# Patient Record
Sex: Female | Born: 1980 | Race: Black or African American | Hispanic: No | Smoking: Former smoker
Health system: Southern US, Community
[De-identification: ages and names within clinical notes are randomized; demographics above are authoritative.]

## PROBLEM LIST (undated history)

## (undated) DIAGNOSIS — N390 Urinary tract infection, site not specified: Secondary | ICD-10-CM

## (undated) DIAGNOSIS — F329 Major depressive disorder, single episode, unspecified: Secondary | ICD-10-CM

## (undated) DIAGNOSIS — K219 Gastro-esophageal reflux disease without esophagitis: Secondary | ICD-10-CM

## (undated) DIAGNOSIS — T7840XA Allergy, unspecified, initial encounter: Secondary | ICD-10-CM

## (undated) DIAGNOSIS — E282 Polycystic ovarian syndrome: Secondary | ICD-10-CM

## (undated) DIAGNOSIS — F319 Bipolar disorder, unspecified: Secondary | ICD-10-CM

## (undated) DIAGNOSIS — I10 Essential (primary) hypertension: Secondary | ICD-10-CM

## (undated) DIAGNOSIS — F32A Depression, unspecified: Secondary | ICD-10-CM

## (undated) DIAGNOSIS — J45909 Unspecified asthma, uncomplicated: Secondary | ICD-10-CM

## (undated) HISTORY — DX: Unspecified asthma, uncomplicated: J45.909

## (undated) HISTORY — PX: WISDOM TOOTH EXTRACTION: SHX21

## (undated) HISTORY — PX: OTHER SURGICAL HISTORY: SHX169

## (undated) HISTORY — DX: Allergy, unspecified, initial encounter: T78.40XA

---
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
## 2001-01-15 ENCOUNTER — Inpatient Hospital Stay (HOSPITAL_COMMUNITY): Admission: AD | Admit: 2001-01-15 | Discharge: 2001-01-15 | Payer: Self-pay | Admitting: Obstetrics & Gynecology

## 2001-01-15 ENCOUNTER — Encounter: Payer: Self-pay | Admitting: Gynecology

## 2001-02-19 ENCOUNTER — Emergency Department (HOSPITAL_COMMUNITY): Admission: EM | Admit: 2001-02-19 | Discharge: 2001-02-19 | Payer: Self-pay | Admitting: *Deleted

## 2001-02-19 ENCOUNTER — Encounter: Payer: Self-pay | Admitting: *Deleted

## 2001-03-25 ENCOUNTER — Emergency Department (HOSPITAL_COMMUNITY): Admission: EM | Admit: 2001-03-25 | Discharge: 2001-03-25 | Payer: Self-pay | Admitting: Emergency Medicine

## 2001-05-18 ENCOUNTER — Inpatient Hospital Stay (HOSPITAL_COMMUNITY): Admission: AD | Admit: 2001-05-18 | Discharge: 2001-05-18 | Payer: Self-pay | Admitting: *Deleted

## 2001-08-12 ENCOUNTER — Ambulatory Visit (HOSPITAL_COMMUNITY): Admission: RE | Admit: 2001-08-12 | Discharge: 2001-08-12 | Payer: Self-pay | Admitting: *Deleted

## 2001-11-17 ENCOUNTER — Inpatient Hospital Stay (HOSPITAL_COMMUNITY): Admission: AD | Admit: 2001-11-17 | Discharge: 2001-11-17 | Payer: Self-pay | Admitting: *Deleted

## 2002-01-13 ENCOUNTER — Inpatient Hospital Stay (HOSPITAL_COMMUNITY): Admission: AD | Admit: 2002-01-13 | Discharge: 2002-01-16 | Payer: Self-pay | Admitting: Family Medicine

## 2002-01-13 ENCOUNTER — Inpatient Hospital Stay (HOSPITAL_COMMUNITY): Admission: AD | Admit: 2002-01-13 | Discharge: 2002-01-13 | Payer: Self-pay | Admitting: Family Medicine

## 2003-03-13 ENCOUNTER — Emergency Department (HOSPITAL_COMMUNITY): Admission: EM | Admit: 2003-03-13 | Discharge: 2003-03-14 | Payer: Self-pay | Admitting: Emergency Medicine

## 2003-06-22 ENCOUNTER — Other Ambulatory Visit: Admission: RE | Admit: 2003-06-22 | Discharge: 2003-06-22 | Payer: Self-pay | Admitting: Family Medicine

## 2003-06-22 ENCOUNTER — Other Ambulatory Visit: Admission: RE | Admit: 2003-06-22 | Discharge: 2003-06-22 | Payer: Self-pay | Admitting: *Deleted

## 2003-07-18 ENCOUNTER — Encounter: Admission: RE | Admit: 2003-07-18 | Discharge: 2003-07-18 | Payer: Self-pay | Admitting: Family Medicine

## 2003-11-21 ENCOUNTER — Other Ambulatory Visit: Admission: RE | Admit: 2003-11-21 | Discharge: 2003-11-21 | Payer: Self-pay | Admitting: Family Medicine

## 2003-11-28 ENCOUNTER — Encounter: Admission: RE | Admit: 2003-11-28 | Discharge: 2003-11-28 | Payer: Self-pay | Admitting: Family Medicine

## 2004-02-10 ENCOUNTER — Ambulatory Visit (HOSPITAL_COMMUNITY): Admission: RE | Admit: 2004-02-10 | Discharge: 2004-02-10 | Payer: Self-pay | Admitting: *Deleted

## 2005-05-30 ENCOUNTER — Ambulatory Visit (HOSPITAL_COMMUNITY): Admission: RE | Admit: 2005-05-30 | Discharge: 2005-05-30 | Payer: Self-pay | Admitting: Family Medicine

## 2005-05-30 ENCOUNTER — Emergency Department (HOSPITAL_COMMUNITY): Admission: EM | Admit: 2005-05-30 | Discharge: 2005-05-30 | Payer: Self-pay | Admitting: Family Medicine

## 2007-01-25 ENCOUNTER — Emergency Department (HOSPITAL_COMMUNITY): Admission: EM | Admit: 2007-01-25 | Discharge: 2007-01-25 | Payer: Self-pay | Admitting: Emergency Medicine

## 2007-10-16 ENCOUNTER — Emergency Department (HOSPITAL_COMMUNITY): Admission: EM | Admit: 2007-10-16 | Discharge: 2007-10-16 | Payer: Self-pay | Admitting: Emergency Medicine

## 2009-07-06 ENCOUNTER — Ambulatory Visit: Admission: RE | Admit: 2009-07-06 | Discharge: 2009-07-06 | Payer: Self-pay | Admitting: Psychiatry

## 2009-07-07 ENCOUNTER — Ambulatory Visit: Payer: Self-pay | Admitting: Psychiatry

## 2009-07-07 ENCOUNTER — Inpatient Hospital Stay (HOSPITAL_COMMUNITY): Admission: AD | Admit: 2009-07-07 | Discharge: 2009-07-14 | Payer: Self-pay | Admitting: Psychiatry

## 2009-07-19 ENCOUNTER — Other Ambulatory Visit (HOSPITAL_COMMUNITY): Admission: RE | Admit: 2009-07-19 | Discharge: 2009-10-11 | Payer: Self-pay | Admitting: Psychiatry

## 2009-09-22 ENCOUNTER — Ambulatory Visit (HOSPITAL_COMMUNITY): Payer: Self-pay | Admitting: Psychiatry

## 2009-10-06 ENCOUNTER — Ambulatory Visit (HOSPITAL_COMMUNITY): Payer: Self-pay | Admitting: Psychiatry

## 2009-10-24 ENCOUNTER — Ambulatory Visit (HOSPITAL_COMMUNITY): Payer: Self-pay | Admitting: Psychiatry

## 2009-12-08 ENCOUNTER — Ambulatory Visit (HOSPITAL_COMMUNITY): Payer: Self-pay | Admitting: Psychiatry

## 2009-12-10 ENCOUNTER — Emergency Department (HOSPITAL_COMMUNITY): Admission: EM | Admit: 2009-12-10 | Discharge: 2009-12-11 | Payer: Self-pay | Admitting: Emergency Medicine

## 2010-02-08 ENCOUNTER — Emergency Department (HOSPITAL_COMMUNITY): Admission: EM | Admit: 2010-02-08 | Discharge: 2009-07-07 | Payer: Self-pay | Admitting: Emergency Medicine

## 2010-03-20 ENCOUNTER — Ambulatory Visit (HOSPITAL_COMMUNITY)
Admission: RE | Admit: 2010-03-20 | Discharge: 2010-03-20 | Payer: Self-pay | Source: Home / Self Care | Attending: Psychiatry | Admitting: Psychiatry

## 2010-03-28 ENCOUNTER — Ambulatory Visit (HOSPITAL_COMMUNITY)
Admission: RE | Admit: 2010-03-28 | Discharge: 2010-03-28 | Payer: Self-pay | Source: Home / Self Care | Attending: Psychiatry | Admitting: Psychiatry

## 2010-04-03 ENCOUNTER — Ambulatory Visit (HOSPITAL_COMMUNITY)
Admission: RE | Admit: 2010-04-03 | Discharge: 2010-04-03 | Payer: Self-pay | Source: Home / Self Care | Attending: Marriage and Family Therapist | Admitting: Marriage and Family Therapist

## 2010-04-26 ENCOUNTER — Encounter (HOSPITAL_COMMUNITY): Payer: 59 | Admitting: Marriage and Family Therapist

## 2010-04-26 DIAGNOSIS — F3160 Bipolar disorder, current episode mixed, unspecified: Secondary | ICD-10-CM

## 2010-04-27 ENCOUNTER — Encounter (HOSPITAL_COMMUNITY): Payer: 59 | Admitting: Physician Assistant

## 2010-04-27 DIAGNOSIS — F319 Bipolar disorder, unspecified: Secondary | ICD-10-CM

## 2010-05-03 ENCOUNTER — Encounter (HOSPITAL_COMMUNITY): Payer: 59 | Admitting: Marriage and Family Therapist

## 2010-05-08 ENCOUNTER — Encounter (HOSPITAL_COMMUNITY): Payer: 59 | Admitting: Marriage and Family Therapist

## 2010-05-08 DIAGNOSIS — F3160 Bipolar disorder, current episode mixed, unspecified: Secondary | ICD-10-CM

## 2010-05-16 ENCOUNTER — Encounter (HOSPITAL_COMMUNITY): Payer: 59 | Admitting: Marriage and Family Therapist

## 2010-05-16 LAB — CBC
HCT: 41.8 % (ref 36.0–46.0)
Hemoglobin: 14.2 g/dL (ref 12.0–15.0)
MCH: 32.9 pg (ref 26.0–34.0)
MCV: 96.7 fL (ref 78.0–100.0)
Platelets: 196 10*3/uL (ref 150–400)
RDW: 13 % (ref 11.5–15.5)
WBC: 11 10*3/uL — ABNORMAL HIGH (ref 4.0–10.5)

## 2010-05-16 LAB — DIFFERENTIAL
Eosinophils Absolute: 0.1 10*3/uL (ref 0.0–0.7)
Eosinophils Relative: 1 % (ref 0–5)
Lymphocytes Relative: 42 % (ref 12–46)
Monocytes Relative: 5 % (ref 3–12)

## 2010-05-16 LAB — RAPID URINE DRUG SCREEN, HOSP PERFORMED
Benzodiazepines: NOT DETECTED
Cocaine: NOT DETECTED
Tetrahydrocannabinol: NOT DETECTED

## 2010-05-16 LAB — BASIC METABOLIC PANEL
Creatinine, Ser: 0.77 mg/dL (ref 0.4–1.2)
GFR calc non Af Amer: >60 mL/min (ref >60)

## 2010-05-16 LAB — URINALYSIS, ROUTINE W REFLEX MICROSCOPIC
Glucose, UA: NEGATIVE mg/dL
Protein, ur: NEGATIVE mg/dL
Specific Gravity, Urine: 1.008 (ref 1.005–1.030)
pH: 6.5 (ref 5.0–8.0)

## 2010-05-16 LAB — PREGNANCY, URINE: Preg Test, Ur: NEGATIVE

## 2010-05-16 LAB — ETHANOL: Alcohol, Ethyl (B): 5 mg/dL (ref 0–10)

## 2010-05-20 LAB — URINE DRUGS OF ABUSE SCREEN W ALC, ROUTINE (REF LAB)
Amphetamine Screen, Ur: NEGATIVE
Barbiturate Quant, Ur: NEGATIVE
Cocaine Metabolites: NEGATIVE
Marijuana Metabolite: NEGATIVE
Methadone: NEGATIVE
Opiate Screen, Urine: NEGATIVE
Phencyclidine (PCP): NEGATIVE
Propoxyphene: NEGATIVE

## 2010-05-21 ENCOUNTER — Encounter (HOSPITAL_COMMUNITY): Payer: 59 | Admitting: Marriage and Family Therapist

## 2010-05-21 DIAGNOSIS — F3160 Bipolar disorder, current episode mixed, unspecified: Secondary | ICD-10-CM

## 2010-05-21 LAB — URINE DRUGS OF ABUSE SCREEN W ALC, ROUTINE (REF LAB)
Amphetamine Screen, Ur: NEGATIVE
Amphetamine Screen, Ur: NEGATIVE
Barbiturate Quant, Ur: NEGATIVE
Cocaine Metabolites: NEGATIVE
Creatinine,U: 146.7 mg/dL
Creatinine,U: 53.5 mg/dL
Ethyl Alcohol: <10 mg/dL (ref <10)
Marijuana Metabolite: NEGATIVE
Methadone: NEGATIVE
Opiate Screen, Urine: NEGATIVE
Opiate Screen, Urine: NEGATIVE
Propoxyphene: NEGATIVE

## 2010-05-21 LAB — BENZODIAZEPINE, QUANTITATIVE, URINE
Alprazolam (GC/LC/MS), ur confirm: NEGATIVE NG/ML
Nordiazepam GC/MS Conf: NEGATIVE NG/ML
Oxazepam GC/MS Conf: 243 NG/ML — ABNORMAL HIGH
Temazepam GC/MS Conf: NEGATIVE NG/ML

## 2010-05-22 LAB — URINALYSIS, MICROSCOPIC ONLY
Glucose, UA: NEGATIVE mg/dL
Ketones, ur: NEGATIVE mg/dL
Protein, ur: NEGATIVE mg/dL

## 2010-05-22 LAB — DIFFERENTIAL
Basophils Absolute: 0 10*3/uL (ref 0.0–0.1)
Eosinophils Relative: 3 % (ref 0–5)
Lymphocytes Relative: 37 % (ref 12–46)
Lymphs Abs: 3.4 10*3/uL (ref 0.7–4.0)
Monocytes Absolute: 0.5 10*3/uL (ref 0.1–1.0)
Monocytes Relative: 5 % (ref 3–12)
Neutrophils Relative %: 55 % (ref 43–77)

## 2010-05-22 LAB — BASIC METABOLIC PANEL
BUN: 10 mg/dL (ref 6–23)
Calcium: 8.9 mg/dL (ref 8.4–10.5)
Creatinine, Ser: 0.75 mg/dL (ref 0.4–1.2)
GFR calc non Af Amer: >60 mL/min (ref >60)
Potassium: 3.4 mEq/L — ABNORMAL LOW (ref 3.5–5.1)
Sodium: 136 mEq/L (ref 135–145)

## 2010-05-22 LAB — CBC
RDW: 13.7 % (ref 11.5–15.5)
WBC: 9.1 10*3/uL (ref 4.0–10.5)

## 2010-05-22 LAB — URINALYSIS, ROUTINE W REFLEX MICROSCOPIC
Leukocytes, UA: NEGATIVE
Nitrite: NEGATIVE
Protein, ur: NEGATIVE mg/dL
Specific Gravity, Urine: 1.016 (ref 1.005–1.030)
Urobilinogen, UA: 1 mg/dL (ref 0.0–1.0)
pH: 6 (ref 5.0–8.0)

## 2010-05-22 LAB — RAPID URINE DRUG SCREEN, HOSP PERFORMED
Amphetamines: NOT DETECTED
Barbiturates: NOT DETECTED
Benzodiazepines: POSITIVE — AB

## 2010-05-22 LAB — URINE MICROSCOPIC-ADD ON

## 2010-05-22 LAB — POCT PREGNANCY, URINE: Preg Test, Ur: NEGATIVE

## 2010-05-29 ENCOUNTER — Encounter (HOSPITAL_COMMUNITY): Payer: 59 | Admitting: Physician Assistant

## 2010-05-29 DIAGNOSIS — F319 Bipolar disorder, unspecified: Secondary | ICD-10-CM

## 2010-06-05 ENCOUNTER — Encounter (HOSPITAL_COMMUNITY): Payer: 59 | Admitting: Marriage and Family Therapist

## 2010-06-07 ENCOUNTER — Emergency Department (HOSPITAL_COMMUNITY)
Admission: EM | Admit: 2010-06-07 | Discharge: 2010-06-07 | Disposition: A | Discharge status: Home / Self Care | Payer: 59 | Attending: Emergency Medicine | Admitting: Emergency Medicine

## 2010-06-07 ENCOUNTER — Emergency Department (HOSPITAL_COMMUNITY): Payer: 59

## 2010-06-07 DIAGNOSIS — Z79899 Other long term (current) drug therapy: Secondary | ICD-10-CM | POA: Insufficient documentation

## 2010-06-07 DIAGNOSIS — R42 Dizziness and giddiness: Secondary | ICD-10-CM | POA: Insufficient documentation

## 2010-06-07 DIAGNOSIS — F319 Bipolar disorder, unspecified: Secondary | ICD-10-CM | POA: Insufficient documentation

## 2010-06-07 LAB — COMPREHENSIVE METABOLIC PANEL
ALT: 18 U/L (ref 0–35)
Alkaline Phosphatase: 33 U/L — ABNORMAL LOW (ref 39–117)
CO2: 25 mEq/L (ref 19–32)
Chloride: 105 mEq/L (ref 96–112)
GFR calc non Af Amer: >60 mL/min (ref >60)
Glucose, Bld: 83 mg/dL (ref 70–99)
Potassium: 3.4 mEq/L — ABNORMAL LOW (ref 3.5–5.1)
Sodium: 138 mEq/L (ref 135–145)

## 2010-06-07 LAB — VALPROIC ACID LEVEL: Valproic Acid Lvl: 33.9 ug/mL — ABNORMAL LOW (ref 50.0–100.0)

## 2010-06-07 LAB — CBC
HCT: 42 % (ref 36.0–46.0)
Hemoglobin: 13.9 g/dL (ref 12.0–15.0)
MCHC: 33.1 g/dL (ref 30.0–36.0)
RBC: 4.44 MIL/uL (ref 3.87–5.11)
WBC: 7.4 10*3/uL (ref 4.0–10.5)

## 2010-06-12 ENCOUNTER — Encounter (HOSPITAL_COMMUNITY): Payer: 59 | Admitting: Marriage and Family Therapist

## 2010-06-14 ENCOUNTER — Encounter (HOSPITAL_COMMUNITY): Payer: 59 | Admitting: Marriage and Family Therapist

## 2010-06-20 ENCOUNTER — Encounter (HOSPITAL_COMMUNITY): Payer: 59 | Admitting: Marriage and Family Therapist

## 2010-06-28 ENCOUNTER — Encounter (HOSPITAL_COMMUNITY): Payer: 59 | Admitting: Marriage and Family Therapist

## 2010-06-28 DIAGNOSIS — F3160 Bipolar disorder, current episode mixed, unspecified: Secondary | ICD-10-CM

## 2010-07-01 ENCOUNTER — Inpatient Hospital Stay (HOSPITAL_COMMUNITY): Admission: RE | Admit: 2010-07-01 | Payer: 59 | Source: Ambulatory Visit

## 2010-07-06 ENCOUNTER — Encounter (HOSPITAL_COMMUNITY): Payer: 59 | Admitting: Physician Assistant

## 2010-07-06 DIAGNOSIS — F319 Bipolar disorder, unspecified: Secondary | ICD-10-CM

## 2010-07-20 NOTE — Discharge Summary (Signed)
[facility]  Patient:    [name], [name]                         MRN: [id number] Adm. Date:  [id number] Disc. Date: 11/14/99 Attending:  Otilio Saber                           Discharge Summary  ADMISSION DIAGNOSES: Axis I:    Major depression, single episode, with severe suicidal ideas. Axis II:   Deferred. Axis III:  Bronchial asthma. Axis IV:   Severe, primary support group. Axis V:    Global assessment of functioning on admission 35 highest in past            year is 75.  DISCHARGE DIAGNOSES: Axis I:    Major depressive disorder, single episode, with suicidal ideas. Axis II:   Deferred. Axis III:  Bronchial asthma. Axis IV:   Severe, primary support group. Axis V:    Global assessment of functioning on admission 35, on            discharge 80.  BRIEF HISTORY:  The patient is a 30 year old single African-American female admitted on a voluntary basis because of depression and suicidal ideas with a plan to cut her wrists or overdose.  The patient has been depressed through the summer and on admission she endorsed symptoms of anhedonia, anorexia, insomnia, feelings of uselessness and hopeless with suicidal ideas and plans to take either pills or cut herself with a knife.  Stressors included economic issues and being overwhelmed at school.  PERTINENT PHYSICAL AND LABORATORY DATA:  Physical examination on admission was unremarkable.  CBC was within normal limits.  Values on routine chemistry including electrolytes and liver profile were normal.  Thyroid profile was normal.  Urine pregnancy test was negative.  HOSPITAL COURSE:  The patient was established on a combination of Celexa and Ambien with excellent response.  She slept well for the night she was in the hospital and reported this has helped her a great deal.  She tolerated the Celexa and Ambien well and without side effects.  CONDITION ON DISCHARGE:  The patient is much improved.   Suicidal aspirations have totally resolved.  Her affect is full and her mood is stable.  She is hopeful and ready to go back to school at [employer] and expects to perform well within the context of the school.  We discussed follow-up and she does not have a primary physician to discuss her medications.  She agreed to go to the mental health center once or twice until she establishes with a primary care physician.  In the meantime, she is to call her insurance company and find out the name of therapists on their panel and establish with one of their therapists in outpatient psychotherapy.  MEDICATIONS:  The patient is discharged with prescriptions for Celexa 20 mg daily and Ambien 5 mg h.s. p.r.n.  FOLLOW-UP:  She is to follow with a therapist and to follow at [facility] for the time being.  She was advised not drink alcoholic beverages or use drugs. DD:  11/14/99 TD:  11/14/99 Job: [id number]
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
## 2010-07-20 NOTE — Op Note (Signed)
[name], PETZ                ACCOUNT NO.:  [id number]   MEDICAL RECORD NO.:  [id number]          PATIENT TYPE:  AMB   LOCATION:  SDC                           FACILITY:  [facility]   PHYSICIAN:  [place] B. Earlene Plater, M.D.  DATE OF BIRTH:  [birth year]   DATE OF PROCEDURE:  02/10/2004  DATE OF DISCHARGE:                                 OPERATIVE REPORT   PREOPERATIVE DIAGNOSIS:  Chronic right lower quadrant pain and dysmenorrhea.   POSTOPERATIVE DIAGNOSIS:  Chronic right lower quadrant pain and  dysmenorrhea.   PROCEDURE:  Open diagnostic laparoscopy.   SURGEON:  Chester Holstein. Earlene Plater, M.D.   ANESTHESIA:  General.   FINDINGS:  Normal appearing uterus, tubes, and ovaries, right lower  quadrant, appendix, gallbladder, liver edge, and stomach.  No evidence of  endometriosis or pelvic adhesive disease.   ESTIMATED BLOOD LOSS:  Less than 25 mL.   COMPLICATIONS:  None.   INDICATIONS FOR PROCEDURE:  The patient with a long history of chronic right  lower quadrant pain and dysmenorrhea with associated dyspareunia.  This has  not respond to conservative medical therapy.  The patient presents for  diagnostic testing and potential treatment if pathology identified.   The patient was advised of the risks of surgery including infection,  bleeding, damage to bowel, bladder, or other organs.   DESCRIPTION OF PROCEDURE:  The patient was taken to the operating room and  general anesthesia was obtained.  The patient was prepped and draped in the  usual sterile fashion and Foley catheter inserted into the bladder.   The speculum was inserted.  A single tooth tenaculum attached to the  anterior lip of the cervix and Hulka tenaculum inserted.   A 10 mm infraumbilical incision was made with a knife and carried sharply to  the fascia.  The fascia was divided sharply and elevated with Kocher clamps.  A posterior sheath and peritoneum were elevated and entered sharply.  Pursestring suture of 0 Vicryl placed  around the fascial defect.  Hasson  cannula inserted and secured.  Pneumoperitoneum obtained with CO2 gas.   Trendelenburg position obtained.  Bowel mobilized superiorly with blunt  probe through the operative scope.  The abdomen and pelvis were inspected  with the above findings noted.   As no pathology was identified, the procedure was terminated.   The scope was removed and gas released.  The incision was elevated with [employer] retractors, the gas released completely, and the pursestring sutures  snugged down.  This obliterated the fascial defect and no intra-abdominal  contents herniated through prior to closure.  The skin was closed with a  subcuticular 4-0 Vicryl.   The Hulka was removed and the cervix hemostatic.   The patient tolerated the procedure well and there were no complications.  She was taken to the recovery room awake, alert, and in stable condition.     Wesl   WBD/MEDQ  D:  02/10/2004  T:  02/10/2004  Job:  [id number]

## 2010-08-14 LAB — ANTIBODY SCREEN: Antibody Screen: NEGATIVE

## 2010-08-14 LAB — ABO/RH: RH Type: POSITIVE

## 2010-08-14 LAB — HIV ANTIBODY (ROUTINE TESTING W REFLEX): HIV: NONREACTIVE

## 2010-09-11 ENCOUNTER — Encounter (HOSPITAL_COMMUNITY): Payer: 59 | Admitting: Physician Assistant

## 2010-11-01 ENCOUNTER — Inpatient Hospital Stay (HOSPITAL_COMMUNITY)
Admission: AD | Admit: 2010-11-01 | Discharge: 2010-11-01 | Disposition: A | Discharge status: Home / Self Care | Payer: 59 | Source: Ambulatory Visit | Attending: Obstetrics and Gynecology | Admitting: Obstetrics and Gynecology

## 2010-11-01 ENCOUNTER — Encounter (HOSPITAL_COMMUNITY): Payer: Self-pay | Admitting: *Deleted

## 2010-11-01 DIAGNOSIS — M62830 Muscle spasm of back: Secondary | ICD-10-CM

## 2010-11-01 DIAGNOSIS — M5380 Other specified dorsopathies, site unspecified: Secondary | ICD-10-CM | POA: Insufficient documentation

## 2010-11-01 DIAGNOSIS — M259 Joint disorder, unspecified: Secondary | ICD-10-CM | POA: Insufficient documentation

## 2010-11-01 DIAGNOSIS — F319 Bipolar disorder, unspecified: Secondary | ICD-10-CM | POA: Insufficient documentation

## 2010-11-01 DIAGNOSIS — M899 Disorder of bone, unspecified: Secondary | ICD-10-CM | POA: Insufficient documentation

## 2010-11-01 DIAGNOSIS — O9989 Other specified diseases and conditions complicating pregnancy, childbirth and the puerperium: Secondary | ICD-10-CM | POA: Insufficient documentation

## 2010-11-01 DIAGNOSIS — O99340 Other mental disorders complicating pregnancy, unspecified trimester: Secondary | ICD-10-CM | POA: Insufficient documentation

## 2010-11-01 HISTORY — DX: Urinary tract infection, site not specified: N39.0

## 2010-11-01 HISTORY — DX: Major depressive disorder, single episode, unspecified: F32.9

## 2010-11-01 HISTORY — DX: Polycystic ovarian syndrome: E28.2

## 2010-11-01 HISTORY — DX: Depression, unspecified: F32.A

## 2010-11-01 HISTORY — DX: Essential (primary) hypertension: I10

## 2010-11-01 HISTORY — DX: Bipolar disorder, unspecified: F31.9

## 2010-11-01 LAB — URINALYSIS, ROUTINE W REFLEX MICROSCOPIC
Bilirubin Urine: NEGATIVE
Hgb urine dipstick: NEGATIVE
Ketones, ur: NEGATIVE mg/dL
Nitrite: NEGATIVE
Urobilinogen, UA: 0.2 mg/dL (ref 0.0–1.0)

## 2010-11-01 LAB — DIFFERENTIAL
Eosinophils Absolute: 0.2 10*3/uL (ref 0.0–0.7)
Lymphocytes Relative: 27 % (ref 12–46)
Lymphs Abs: 3.1 10*3/uL (ref 0.7–4.0)
Monocytes Relative: 7 % (ref 3–12)
Neutrophils Relative %: 64 % (ref 43–77)

## 2010-11-01 LAB — CBC
Hemoglobin: 11.9 g/dL — ABNORMAL LOW (ref 12.0–15.0)
MCH: 30.6 pg (ref 26.0–34.0)
MCV: 90 fL (ref 78.0–100.0)
RBC: 3.89 MIL/uL (ref 3.87–5.11)
WBC: 11.4 10*3/uL — ABNORMAL HIGH (ref 4.0–10.5)

## 2010-11-01 LAB — WET PREP, GENITAL: Clue Cells Wet Prep HPF POC: NONE SEEN

## 2010-11-01 MED ORDER — CYCLOBENZAPRINE HCL 10 MG PO TABS: 10.0000 mg | ORAL_TABLET | Freq: Three times a day (TID) | ORAL | Status: AC | PRN

## 2010-11-01 MED ORDER — CYCLOBENZAPRINE HCL 10 MG PO TABS
10.0000 mg | ORAL_TABLET | Freq: Once | ORAL | Status: AC
  Administered 2010-11-01: 10 mg via ORAL
  Filled 2010-11-01: qty 1

## 2010-11-01 NOTE — ED Provider Notes (Signed)
History     Chief Complaint  Patient presents with  . Back Pain   HPI   See previous note--Dr. Ellyn Hack signed prior to completion.   Past Medical History  Diagnosis Date  . Depression   . Bipolar 1 disorder   . Hypertension   . UTI (lower urinary tract infection)   . PCOS (polycystic ovarian syndrome)     Past Surgical History  Procedure Date  . Cyst removed     ovarian cyst removed  . Wisdom tooth extraction     No family history on file.  History  Substance Use Topics  . Smoking status: Former Smoker    Types: Cigarettes  . Smokeless tobacco: Not on file  . Alcohol Use: No    Allergies: No Known Allergies  Prescriptions prior to admission  Medication Sig Dispense Refill  . acetaminophen (TYLENOL) 325 MG tablet Take 650 mg by mouth every 6 (six) hours as needed.        . prenatal vitamin w/FE, FA (PRENATAL 1 + 1) 27-1 MG TABS Take 1 tablet by mouth daily.        . ranitidine (ZANTAC) 150 MG tablet Take 150 mg by mouth 2 (two) times daily.          ROS Physical Exam   Blood pressure 149/83, pulse 84, temperature 99.2 F (37.3 C), temperature source Oral, resp. rate 20, height 5\' 3"  (1.6 m), weight 203 lb (92.08 kg).  Physical Exam  MAU Course  Procedures  MDM   Flexeril 10mg  po given in MAU.  Assessment and Plan  A:  Back spasm P:  Flexeril 10mg  po tid prn #20.  Keep appointment with Dr. Ellyn Hack. KEY,EVE Judie Petit 11/01/2010, 9:38 PM   Matt Holmes, NP 11/07/10 2051

## 2010-11-01 NOTE — Progress Notes (Signed)
Pt states she has been telling her physician for several weeks that she has pain with urination and frequency but her urinalysis was nml

## 2010-11-01 NOTE — Progress Notes (Signed)
Pt reports lower back "spasms" today, this pm had two tightening sensations in lower abd. Today has had abn vaginal discharge that is yellow mucus. Yesterday noted her underwear were damp/wet.

## 2010-11-01 NOTE — Progress Notes (Signed)
Patient is here with c/o chronic back pain that started radiated to her lower abdomen. She describes it as spasm and intense pain. She is also c/o yellow vaginal discharge. That was watery yesterday. Reports good fetal movement and denies any vaginal bleeding.

## 2010-11-01 NOTE — ED Provider Notes (Signed)
History     Chief Complaint  Patient presents with  . Back Pain   HPIChivon E Boyd 30 y.o.  is a patient of Dr. Emeline Boyd who presents with back pain.  Hx of chronic back pain but today it has changed to "spasms that roll to the front and the lower stomach is burning".  She is [redacted]w[redacted]d.  G2 P1.  She also states she has a yellow discharge X 2 months.  She reports good fetal movement.  Denies vaginal bleeding or leaking of fluid.    Past Medical History  Diagnosis Date  . Depression   . Bipolar 1 disorder   . Hypertension   . UTI (lower urinary tract infection)   . PCOS (polycystic ovarian syndrome)     Past Surgical History  Procedure Date  . Cyst removed     ovarian cyst removed  . Wisdom tooth extraction     No family history on file.  History  Substance Use Topics  . Smoking status: Former Smoker    Types: Cigarettes  . Smokeless tobacco: Not on file  . Alcohol Use: No    Allergies: No Known Allergies  Prescriptions prior to admission  Medication Sig Dispense Refill  . acetaminophen (TYLENOL) 325 MG tablet Take 650 mg by mouth every 6 (six) hours as needed.        . prenatal vitamin w/FE, FA (PRENATAL 1 + 1) 27-1 MG TABS Take 1 tablet by mouth daily.        . ranitidine (ZANTAC) 150 MG tablet Take 150 mg by mouth 2 (two) times daily.          Review of Systems  Constitutional: Negative for fever and chills.  Gastrointestinal: Positive for nausea and abdominal pain. Negative for vomiting.  Genitourinary:       Yellow vaginal discharge  Musculoskeletal: Positive for back pain.   Physical Exam   Blood pressure 149/83, pulse 84, temperature 99.2 F (37.3 C), temperature source Oral, resp. rate 20, height 5\' 3"  (1.6 m), weight 203 lb (92.08 kg).  Physical Exam  Constitutional: She is oriented to person, place, and time. She appears well-developed and well-nourished.       uncomfortable  Neck: Normal range of motion.  Respiratory: Effort normal.  GI: Soft.    Genitourinary: Cervix exhibits no motion tenderness, no discharge and no friability. No erythema or bleeding around the vagina. Vaginal discharge found.       Gravid with uterus at umbilicus.  Nontender.  Negative for flank pain.  Without vaginal bleeding.  Musculoskeletal: Tenderness: lower back.  Neurological: She is alert and oriented to person, place, and time.  Skin: Skin is warm and dry.    MAU Course  Procedures  MDM Consulted with Dr. Ellyn Boyd.  Reported UA, chief complaint and physical exam.  Order given for Flexeril 10mg  po now and may d/c to home with Rx.  She may also take Ibuprofen 600-800mg  po for 2 doses if needed.  Heating pad at home.  Assessment and Plan  Back Spasm [redacted] weeks pregnant  KEY,[name], 8:33 PM

## 2011-02-15 ENCOUNTER — Inpatient Hospital Stay (HOSPITAL_COMMUNITY)
Admission: AD | Admit: 2011-02-15 | Discharge: 2011-02-16 | Disposition: A | Discharge status: Home / Self Care | Payer: 59 | Source: Ambulatory Visit | Attending: Obstetrics and Gynecology | Admitting: Obstetrics and Gynecology

## 2011-02-15 ENCOUNTER — Encounter (HOSPITAL_COMMUNITY): Payer: Self-pay

## 2011-02-15 DIAGNOSIS — J45901 Unspecified asthma with (acute) exacerbation: Secondary | ICD-10-CM

## 2011-02-15 DIAGNOSIS — J45909 Unspecified asthma, uncomplicated: Secondary | ICD-10-CM

## 2011-02-15 DIAGNOSIS — R059 Cough, unspecified: Secondary | ICD-10-CM

## 2011-02-15 DIAGNOSIS — R05 Cough: Secondary | ICD-10-CM

## 2011-02-15 DIAGNOSIS — O99891 Other specified diseases and conditions complicating pregnancy: Secondary | ICD-10-CM | POA: Insufficient documentation

## 2011-02-15 HISTORY — DX: Gastro-esophageal reflux disease without esophagitis: K21.9

## 2011-02-15 MED ORDER — IPRATROPIUM BROMIDE 0.02 % IN SOLN
0.5000 mg | Freq: Once | RESPIRATORY_TRACT | Status: AC
  Administered 2011-02-16: 0.5 mg via RESPIRATORY_TRACT

## 2011-02-15 MED ORDER — ALBUTEROL SULFATE (5 MG/ML) 0.5% IN NEBU
INHALATION_SOLUTION | RESPIRATORY_TRACT | Status: AC
  Filled 2011-02-15: qty 0.5

## 2011-02-15 MED ORDER — IPRATROPIUM BROMIDE 0.02 % IN SOLN
RESPIRATORY_TRACT | Status: AC
  Administered 2011-02-16: 0.5 mg via RESPIRATORY_TRACT
  Filled 2011-02-15: qty 2.5

## 2011-02-15 MED ORDER — ALBUTEROL SULFATE (5 MG/ML) 0.5% IN NEBU
2.5000 mg | INHALATION_SOLUTION | RESPIRATORY_TRACT | Status: DC
  Administered 2011-02-16: 2.5 mg via RESPIRATORY_TRACT
  Filled 2011-02-15: qty 0.5

## 2011-02-15 NOTE — ED Provider Notes (Signed)
History     Chief Complaint  Patient presents with  . Respiratory Distress   HPI [name] 30 y.o. 35w 5d gestation.  Feels like she cannot get her breath.  History of asthma, but has not had a problem in years.  Took a nebulizer treatment and 5:30 pm today and then later when moving around still having difficulty taking a deep breath.   OB History    Grav Para Term Preterm Abortions TAB SAB Ect Mult Living   2 1 1       1       Past Medical History  Diagnosis Date  . Depression   . Bipolar 1 disorder   . Hypertension   . UTI (lower urinary tract infection)   . PCOS (polycystic ovarian syndrome)   . GERD (gastroesophageal reflux disease)     Past Surgical History  Procedure Date  . Cyst removed     ovarian cyst removed  . Wisdom tooth extraction     History reviewed. No pertinent family history.  History  Substance Use Topics  . Smoking status: Former Smoker    Types: Cigarettes  . Smokeless tobacco: Not on file  . Alcohol Use: No    Allergies: No Known Allergies  Prescriptions prior to admission  Medication Sig Dispense Refill  . acetaminophen (TYLENOL) 325 MG tablet Take 650 mg by mouth every 6 (six) hours as needed.        . prenatal vitamin w/FE, FA (PRENATAL 1 + 1) 27-1 MG TABS Take 1 tablet by mouth daily.          ROS Physical Exam   Height 5\' 4"  (1.626 m), weight 215 lb 6 oz (97.693 kg).  Physical Exam  Nursing note and vitals reviewed. Constitutional: She is oriented to person, place, and time. She appears well-developed and well-nourished.  HENT:  Head: Normocephalic.  Eyes: EOM are normal.  Neck: Neck supple.  Respiratory: She has wheezes.       Breathing fast.  Expiratory wheezing in all lung fields.  O2 sat 99%.  Musculoskeletal: Normal range of motion.  Neurological: She is alert and oriented to person, place, and time.  Skin: Skin is warm and dry.  Psychiatric: She has a normal mood and affect.    MAU Course  Procedures Had  one nebulizer treatment- continued to have expiratory wheezing in all lung fields after the nebulizer treatment.  Consult with Dr. Ellyn Hack.  Will repeat nebulizer. Second nebulizer treatment done.  Client much improved.  Still has some expiratory and inspiratory wheezing, but is much better than before second nebulizer Has had instruction on using inhaler and has albuterol inhaler to go home.    MDM  Assessment and Plan  Acute asthma attack in pregnancy  Plan Albuterol inhaler 2 puffs every 4 hours Be seen in the office this week - appt on Thursday.   BURLESON,TERRI 02/15/2011, 11:36 PM   Nolene Bernheim, NP 02/16/11 1308

## 2011-02-15 NOTE — Progress Notes (Signed)
Pt states, " I couldn't breathe good yesterday and had a sore throat, but today it is a lot worse. I've used my nebulizer today, but it wasn't helping."

## 2011-02-15 NOTE — Progress Notes (Signed)
Patient is here with c/o shortness of breath that started today. She states that she had no relief from albuterol inhalaer that she used at 1730pm. She states that she started having cold like symptoms (congestion and headache and face aches) yesterday. She has a nonproductive cough, no audible wheezing but is has shallow breathing. o2 saturation at room air is 99%. Denies any vaginal bleeding, lof or discharge. She reports good fetal movement

## 2011-02-16 MED ORDER — GUAIFENESIN-CODEINE 100-10 MG/5ML PO SOLN
5.0000 mL | ORAL | Status: AC
  Administered 2011-02-16: 5 mL via ORAL
  Filled 2011-02-16: qty 5

## 2011-02-16 MED ORDER — ALBUTEROL SULFATE (5 MG/ML) 0.5% IN NEBU
2.5000 mg | INHALATION_SOLUTION | RESPIRATORY_TRACT | Status: AC
  Administered 2011-02-16: 2.5 mg via RESPIRATORY_TRACT
  Filled 2011-02-16: qty 0.5

## 2011-02-16 MED ORDER — ALBUTEROL SULFATE HFA 108 (90 BASE) MCG/ACT IN AERS: 2.0000 | INHALATION_SPRAY | RESPIRATORY_TRACT | Status: DC

## 2011-02-16 MED ORDER — ALBUTEROL SULFATE (5 MG/ML) 0.5% IN NEBU: 2.5000 mg | INHALATION_SOLUTION | RESPIRATORY_TRACT | Status: DC

## 2011-02-16 MED ORDER — ALBUTEROL SULFATE HFA 108 (90 BASE) MCG/ACT IN AERS
2.0000 | INHALATION_SPRAY | RESPIRATORY_TRACT | Status: DC
  Administered 2011-02-16: 2 via RESPIRATORY_TRACT
  Filled 2011-02-16: qty 6.7

## 2011-03-05 NOTE — L&D Delivery Note (Signed)
Delivery Note At 2:48 PM a viable female was delivered precipitously, nurse present, MD on unit,  via Vaginal, Spontaneous Delivery (Presentation: Left Occiput Anterior).  APGAR: 9, 9; weight 6 lb 4.2 oz (2841 g).   Placenta status: Intact, Spontaneous.  Cord:  with the following complications: None.  Cord blood collected  Anesthesia: Epidural  Episiotomy: None Lacerations: None Suture Repair: N/A Est. Blood Loss (mL): 500  Mom to postpartum.  Baby to nursery-stable.  [name],[name] 03/11/2011, 3:08 PM  Br/A+/R=/POPs

## 2011-03-10 ENCOUNTER — Other Ambulatory Visit: Payer: Self-pay | Admitting: Obstetrics and Gynecology

## 2011-03-11 ENCOUNTER — Encounter (HOSPITAL_COMMUNITY): Payer: Self-pay | Admitting: Anesthesiology

## 2011-03-11 ENCOUNTER — Inpatient Hospital Stay (HOSPITAL_COMMUNITY): Payer: Managed Care, Other (non HMO) | Admitting: Anesthesiology

## 2011-03-11 ENCOUNTER — Encounter (HOSPITAL_COMMUNITY): Payer: Self-pay

## 2011-03-11 ENCOUNTER — Inpatient Hospital Stay (HOSPITAL_COMMUNITY)
Admission: RE | Admit: 2011-03-11 | Discharge: 2011-03-13 | DRG: 775 | Disposition: A | Discharge status: Home / Self Care | Payer: Managed Care, Other (non HMO) | Source: Ambulatory Visit | Attending: Obstetrics and Gynecology | Admitting: Obstetrics and Gynecology

## 2011-03-11 DIAGNOSIS — O99892 Other specified diseases and conditions complicating childbirth: Principal | ICD-10-CM | POA: Diagnosis present

## 2011-03-11 DIAGNOSIS — Z22330 Carrier of Group B streptococcus: Secondary | ICD-10-CM

## 2011-03-11 DIAGNOSIS — Z3490 Encounter for supervision of normal pregnancy, unspecified, unspecified trimester: Secondary | ICD-10-CM

## 2011-03-11 LAB — CBC
Platelets: 164 10*3/uL (ref 150–400)
RBC: 4.19 MIL/uL (ref 3.87–5.11)
RDW: 14.7 % (ref 11.5–15.5)
WBC: 8.4 10*3/uL (ref 4.0–10.5)

## 2011-03-11 LAB — STREP B DNA PROBE: GBS: POSITIVE

## 2011-03-11 MED ORDER — EPHEDRINE 5 MG/ML INJ
10.0000 mg | INTRAVENOUS | Status: DC | PRN
  Filled 2011-03-11: qty 4

## 2011-03-11 MED ORDER — PANTOPRAZOLE SODIUM 20 MG PO TBEC
20.0000 mg | DELAYED_RELEASE_TABLET | Freq: Every day | ORAL | Status: DC
  Administered 2011-03-12: 20 mg via ORAL
  Filled 2011-03-11 (×2): qty 1

## 2011-03-11 MED ORDER — DIPHENHYDRAMINE HCL 50 MG/ML IJ SOLN: 12.5000 mg | INTRAMUSCULAR | Status: DC | PRN

## 2011-03-11 MED ORDER — BUTORPHANOL TARTRATE 2 MG/ML IJ SOLN: 2.0000 mg | INTRAMUSCULAR | Status: DC | PRN

## 2011-03-11 MED ORDER — ONDANSETRON HCL 4 MG/2ML IJ SOLN: 4.0000 mg | Freq: Four times a day (QID) | INTRAMUSCULAR | Status: DC | PRN

## 2011-03-11 MED ORDER — BENZOCAINE-MENTHOL 20-0.5 % EX AERO: 1.0000 "application " | INHALATION_SPRAY | CUTANEOUS | Status: DC | PRN

## 2011-03-11 MED ORDER — ONDANSETRON HCL 4 MG/2ML IJ SOLN: 4.0000 mg | INTRAMUSCULAR | Status: DC | PRN

## 2011-03-11 MED ORDER — LACTATED RINGERS IV SOLN: 500.0000 mL | Freq: Once | INTRAVENOUS | Status: DC

## 2011-03-11 MED ORDER — FENTANYL 2.5 MCG/ML BUPIVACAINE 1/10 % EPIDURAL INFUSION (WH - ANES)
INTRAMUSCULAR | Status: DC | PRN
  Administered 2011-03-11: 14 mL/h via EPIDURAL

## 2011-03-11 MED ORDER — TERBUTALINE SULFATE 1 MG/ML IJ SOLN: 0.2500 mg | Freq: Once | INTRAMUSCULAR | Status: DC | PRN

## 2011-03-11 MED ORDER — LIDOCAINE HCL (PF) 1 % IJ SOLN
30.0000 mL | INTRAMUSCULAR | Status: DC | PRN
  Filled 2011-03-11 (×2): qty 30

## 2011-03-11 MED ORDER — IBUPROFEN 600 MG PO TABS
600.0000 mg | ORAL_TABLET | Freq: Four times a day (QID) | ORAL | Status: DC | PRN
  Filled 2011-03-11: qty 1

## 2011-03-11 MED ORDER — WITCH HAZEL-GLYCERIN EX PADS: 1.0000 "application " | MEDICATED_PAD | CUTANEOUS | Status: DC | PRN

## 2011-03-11 MED ORDER — PHENYLEPHRINE 40 MCG/ML (10ML) SYRINGE FOR IV PUSH (FOR BLOOD PRESSURE SUPPORT)
80.0000 ug | PREFILLED_SYRINGE | INTRAVENOUS | Status: DC | PRN
  Filled 2011-03-11: qty 5

## 2011-03-11 MED ORDER — PENICILLIN G POTASSIUM 5000000 UNITS IJ SOLR
2.5000 10*6.[IU] | INTRAVENOUS | Status: DC
  Administered 2011-03-11: 2.5 10*6.[IU] via INTRAVENOUS
  Filled 2011-03-11 (×5): qty 2.5

## 2011-03-11 MED ORDER — PRENATAL MULTIVITAMIN CH
1.0000 | ORAL_TABLET | Freq: Every day | ORAL | Status: DC
  Administered 2011-03-11 – 2011-03-12 (×2): 1 via ORAL
  Filled 2011-03-11 (×2): qty 1

## 2011-03-11 MED ORDER — SENNOSIDES-DOCUSATE SODIUM 8.6-50 MG PO TABS
2.0000 | ORAL_TABLET | Freq: Every day | ORAL | Status: DC
  Administered 2011-03-11 – 2011-03-12 (×2): 2 via ORAL

## 2011-03-11 MED ORDER — SIMETHICONE 80 MG PO CHEW: 80.0000 mg | CHEWABLE_TABLET | ORAL | Status: DC | PRN

## 2011-03-11 MED ORDER — DIPHENHYDRAMINE HCL 25 MG PO CAPS: 25.0000 mg | ORAL_CAPSULE | Freq: Four times a day (QID) | ORAL | Status: DC | PRN

## 2011-03-11 MED ORDER — LACTATED RINGERS IV SOLN: INTRAVENOUS | Status: DC

## 2011-03-11 MED ORDER — LANOLIN HYDROUS EX OINT: TOPICAL_OINTMENT | CUTANEOUS | Status: DC | PRN

## 2011-03-11 MED ORDER — FENTANYL 2.5 MCG/ML BUPIVACAINE 1/10 % EPIDURAL INFUSION (WH - ANES)
14.0000 mL/h | INTRAMUSCULAR | Status: DC
Start: 2011-03-11 — End: 2011-03-11
  Filled 2011-03-11 (×2): qty 60

## 2011-03-11 MED ORDER — LACTATED RINGERS IV SOLN
INTRAVENOUS | Status: DC
  Administered 2011-03-11 (×2): via INTRAVENOUS

## 2011-03-11 MED ORDER — PRENATAL MULTIVITAMIN CH: 1.0000 | ORAL_TABLET | Freq: Every day | ORAL | Status: DC

## 2011-03-11 MED ORDER — LACTATED RINGERS IV SOLN: 500.0000 mL | INTRAVENOUS | Status: DC | PRN

## 2011-03-11 MED ORDER — PHENYLEPHRINE 40 MCG/ML (10ML) SYRINGE FOR IV PUSH (FOR BLOOD PRESSURE SUPPORT): 80.0000 ug | PREFILLED_SYRINGE | INTRAVENOUS | Status: DC | PRN

## 2011-03-11 MED ORDER — TETANUS-DIPHTH-ACELL PERTUSSIS 5-2.5-18.5 LF-MCG/0.5 IM SUSP: 0.5000 mL | Freq: Once | INTRAMUSCULAR | Status: DC

## 2011-03-11 MED ORDER — ONDANSETRON HCL 4 MG PO TABS: 4.0000 mg | ORAL_TABLET | ORAL | Status: DC | PRN

## 2011-03-11 MED ORDER — INFLUENZA VIRUS VACC SPLIT PF IM SUSP
0.5000 mL | INTRAMUSCULAR | Status: AC
  Administered 2011-03-12: 0.5 mL via INTRAMUSCULAR
  Filled 2011-03-11: qty 0.5

## 2011-03-11 MED ORDER — EPHEDRINE 5 MG/ML INJ: 10.0000 mg | INTRAVENOUS | Status: DC | PRN

## 2011-03-11 MED ORDER — PRENATAL PLUS 27-1 MG PO TABS: 1.0000 | ORAL_TABLET | Freq: Every day | ORAL | Status: DC

## 2011-03-11 MED ORDER — ACETAMINOPHEN 325 MG PO TABS: 650.0000 mg | ORAL_TABLET | ORAL | Status: DC | PRN

## 2011-03-11 MED ORDER — ZOLPIDEM TARTRATE 5 MG PO TABS: 5.0000 mg | ORAL_TABLET | Freq: Every evening | ORAL | Status: DC | PRN

## 2011-03-11 MED ORDER — OXYTOCIN BOLUS FROM INFUSION
500.0000 mL | Freq: Once | INTRAVENOUS | Status: DC
  Filled 2011-03-11: qty 500

## 2011-03-11 MED ORDER — PENICILLIN G POTASSIUM 5000000 UNITS IJ SOLR
5.0000 10*6.[IU] | Freq: Once | INTRAVENOUS | Status: AC
  Administered 2011-03-11: 5 10*6.[IU] via INTRAVENOUS
  Filled 2011-03-11: qty 5

## 2011-03-11 MED ORDER — CITRIC ACID-SODIUM CITRATE 334-500 MG/5ML PO SOLN: 30.0000 mL | ORAL | Status: DC | PRN

## 2011-03-11 MED ORDER — OXYCODONE-ACETAMINOPHEN 5-325 MG PO TABS
1.0000 | ORAL_TABLET | ORAL | Status: DC | PRN
  Administered 2011-03-11: 1 via ORAL
  Filled 2011-03-11: qty 1

## 2011-03-11 MED ORDER — IBUPROFEN 600 MG PO TABS
600.0000 mg | ORAL_TABLET | Freq: Four times a day (QID) | ORAL | Status: DC
  Administered 2011-03-11 – 2011-03-13 (×7): 600 mg via ORAL
  Filled 2011-03-11 (×6): qty 1

## 2011-03-11 MED ORDER — PANTOPRAZOLE SODIUM 40 MG PO TBEC
40.0000 mg | DELAYED_RELEASE_TABLET | Freq: Every day | ORAL | Status: DC
  Administered 2011-03-11: 40 mg via ORAL
  Filled 2011-03-11 (×2): qty 1

## 2011-03-11 MED ORDER — DIBUCAINE 1 % RE OINT: 1.0000 "application " | TOPICAL_OINTMENT | RECTAL | Status: DC | PRN

## 2011-03-11 MED ORDER — OXYTOCIN 20 UNITS IN LACTATED RINGERS INFUSION - SIMPLE: 125.0000 mL/h | Freq: Once | INTRAVENOUS | Status: DC

## 2011-03-11 MED ORDER — OXYTOCIN 20 UNITS IN LACTATED RINGERS INFUSION - SIMPLE
1.0000 m[IU]/min | INTRAVENOUS | Status: DC
  Administered 2011-03-11: 2 m[IU]/min via INTRAVENOUS
  Administered 2011-03-11: 6 m[IU]/min via INTRAVENOUS
  Filled 2011-03-11: qty 1000

## 2011-03-11 MED ORDER — LIDOCAINE HCL 1.5 % IJ SOLN
INTRAMUSCULAR | Status: DC | PRN
  Administered 2011-03-11 (×2): 5 mL via EPIDURAL

## 2011-03-11 MED ORDER — OXYCODONE-ACETAMINOPHEN 5-325 MG PO TABS: 2.0000 | ORAL_TABLET | ORAL | Status: DC | PRN

## 2011-03-11 MED ORDER — FLEET ENEMA 7-19 GM/118ML RE ENEM: 1.0000 | ENEMA | RECTAL | Status: DC | PRN

## 2011-03-11 NOTE — Addendum Note (Signed)
Addendum  created 03/11/11 1534 by Sandrea Hughs., MD   Modules edited:Notes Section

## 2011-03-11 NOTE — Anesthesia Procedure Notes (Signed)
Epidural Patient location during procedure: OB Start time: 03/11/2011 11:22 AM End time: 03/11/2011 11:27 AM Reason for block: procedure for pain  Staffing Anesthesiologist: Sandrea Hughs Performed by: anesthesiologist   Preanesthetic Checklist Completed: patient identified, site marked, surgical consent, pre-op evaluation, timeout performed, IV checked, risks and benefits discussed and monitors and equipment checked  Epidural Patient position: sitting Prep: site prepped and draped and DuraPrep Patient monitoring: continuous pulse ox and blood pressure Approach: midline Injection technique: LOR air  Needle:  Needle type: Tuohy  Needle gauge: 17 G Needle length: 9 cm Needle insertion depth: 7 cm Catheter type: closed end flexible Catheter size: 19 Gauge Catheter at skin depth: 12 cm Test dose: negative and 1.5% lidocaine  Assessment Sensory level: T8 Events: blood not aspirated, injection not painful, no injection resistance, negative IV test and no paresthesia

## 2011-03-11 NOTE — Anesthesia Postprocedure Evaluation (Signed)
Anesthesia Post Note  Patient: [name]  Procedure(s) Performed: * No procedures listed *  Anesthesia type: Epidural  Patient location: Mother/Baby  Post pain: Pain level controlled  Post assessment: Post-op Vital signs reviewed  Last Vitals:  Filed Vitals:   03/11/11 1516  BP: 111/91  Pulse:   Temp:     Post vital signs: Reviewed  Level of consciousness: awake  Complications: No apparent anesthesia complications

## 2011-03-11 NOTE — Addendum Note (Signed)
Addendum  created 03/11/11 1534 by John Abbrielle Batts Jr., MD   Modules edited:Notes Section    

## 2011-03-11 NOTE — Progress Notes (Signed)
[name] is a 31 y.o. G2P1001 at 102w1d admitted for induction of labor due to Elective at term.  Subjective: comf with epidural  Objective: BP 128/82  Pulse 90  Temp(Src) 98 F (36.7 C) (Oral)  Ht 5\' 4"  (1.626 m)  Wt 96.163 kg (212 lb)  BMI 36.39 kg/m2  SpO2 100%      FHT:  FHR: 130's bpm, variability: moderate,  accelerations:  Present,  decelerations:  Absent UC:   regular, every 2-3 minutes SVE:   Dilation: 4.5 Effacement (%): 80 Station: -2 Exam by:: [name] AROM for small fluid, clear Labs: Lab Results  Component Value Date   WBC 8.4 03/11/2011   HGB 12.7 03/11/2011   HCT 37.4 03/11/2011   MCV 89.3 03/11/2011   PLT 164 03/11/2011    Assessment / Plan: Induction of labor due to term with favorable cervix,  progressing well on pitocin  Labor: Progressing normally Preeclampsia:  no signs or symptoms of toxicity Fetal Wellbeing:  Category II Pain Control:  Epidural I/D:  n/a Anticipated MOD:  NSVD  [name],[name] 03/11/2011, 12:33 PM

## 2011-03-11 NOTE — H&P (Signed)
[name] is a 31 y.o. female presenting for induction of labor secondary to term status and favorable cervix.  Prenatal care uncomplicated, except GBBS +.  +FM, no LOF< no VB, occ ctx Maternal Medical History:  Contractions: Frequency: irregular.    Fetal activity: Perceived fetal activity is normal.      OB History    Grav Para Term Preterm Abortions TAB SAB Ect Mult Living   2 1 1       1     G1 01/2002 6#9, SVD female G2 present No abn pap, no STD  Past Medical History  Diagnosis Date  . Depression   . Bipolar 1 disorder   . Hypertension   . UTI (lower urinary tract infection)   . PCOS (polycystic ovarian syndrome)   . GERD (gastroesophageal reflux disease)   . Normal pregnancy 03/11/2011   Past Surgical History  Procedure Date  . Cyst removed     ovarian cyst removed  . Wisdom tooth extraction    Family History: anxiety, depression, DM Social History:  reports that she quit smoking about 8 months ago. Her smoking use included Cigarettes. She quit after 15 years of use. She has never used smokeless tobacco. She reports that she does not drink alcohol or use illicit drugs. single, customer service Meds PNV All NKDA  Review of Systems  Constitutional: Negative.   HENT: Negative.   Respiratory: Negative.   Cardiovascular: Negative.   Gastrointestinal: Negative.   Genitourinary: Negative.   Musculoskeletal: Negative.   Neurological: Negative.   Psychiatric/Behavioral: Negative.       Blood pressure 137/94, pulse 87, temperature 98.1 F (36.7 C), temperature source Oral, height 5\' 4"  (1.626 m), weight 96.163 kg (212 lb). Maternal Exam:  Abdomen: Fundal height is appropriate for gestation.   Estimated fetal weight is 7-8#.   Fetal presentation: vertex     Physical Exam  Constitutional: She is oriented to person, place, and time. She appears well-developed and well-nourished.  HENT:  Head: Normocephalic and atraumatic.  Neck: Normal range of motion. Neck  supple. No thyromegaly present.  Cardiovascular: Normal rate and regular rhythm.   Respiratory: Effort normal and breath sounds normal. No respiratory distress.  GI: Soft. Bowel sounds are normal. There is no tenderness.  Musculoskeletal: Normal range of motion.  Neurological: She is alert and oriented to person, place, and time.  Psychiatric: She has a normal mood and affect. Her behavior is normal.    Prenatal labs: ABO, Rh:  A+ Antibody:  neg Rubella:  equivocal RPR:   NR HBsAg:   neg HIV:   neg GBS:   positive Pap WNL, Plt 222K, TSH WNL, Hgb electro WNL, GC neg, Chl neg, CF neg, AFP WNL, 1st Tri Scr WNL, glucola 91  [place] EDC 03/16/10, nl anat, post plac, female  Assessment/Plan: 30yo G2P1 at 39+ for IOL given term and favorable cervix AROM after PCN to augment GBBS + - PCN for prophylaxis Pitocin to augment Epidural for comfort   [name],Giovanny Dugal 03/11/2011, 7:57 AM

## 2011-03-11 NOTE — Anesthesia Preprocedure Evaluation (Signed)
Anesthesia Evaluation  Patient identified by MRN, date of birth, ID band Patient awake    Reviewed: Allergy & Precautions, H&P , NPO status , Patient's Chart, lab work & pertinent test results  Airway Mallampati: II TM Distance: >3 FB Neck ROM: full    Dental No notable dental hx.    Pulmonary neg pulmonary ROS,    Pulmonary exam normal       Cardiovascular hypertension,     Neuro/Psych Negative Neurological ROS  Negative Psych ROS   GI/Hepatic Neg liver ROS, GERD-  Medicated,  Endo/Other  Morbid obesity  Renal/GU negative Renal ROS  Genitourinary negative   Musculoskeletal negative musculoskeletal ROS (+)   Abdominal (+) obese,   Peds negative pediatric ROS (+)  Hematology negative hematology ROS (+)   Anesthesia Other Findings   Reproductive/Obstetrics (+) Pregnancy                           Anesthesia Physical Anesthesia Plan  ASA: II  Anesthesia Plan: Epidural   Post-op Pain Management:    Induction:   Airway Management Planned:   Additional Equipment:   Intra-op Plan:   Post-operative Plan:   Informed Consent: I have reviewed the patients History and Physical, chart, labs and discussed the procedure including the risks, benefits and alternatives for the proposed anesthesia with the patient or authorized representative who has indicated his/her understanding and acceptance.     Plan Discussed with:   Anesthesia Plan Comments:         Anesthesia Quick Evaluation

## 2011-03-12 ENCOUNTER — Encounter (HOSPITAL_COMMUNITY): Payer: Self-pay

## 2011-03-12 LAB — CBC
Hemoglobin: 11.4 g/dL — ABNORMAL LOW (ref 12.0–15.0)
MCH: 30.6 pg (ref 26.0–34.0)
Platelets: 150 10*3/uL (ref 150–400)
RBC: 3.72 MIL/uL — ABNORMAL LOW (ref 3.87–5.11)
WBC: 10.6 10*3/uL — ABNORMAL HIGH (ref 4.0–10.5)

## 2011-03-12 NOTE — Addendum Note (Signed)
Addendum  created 03/12/11 1610 by Doreene Burke   Modules edited:Charges VN, Notes Section

## 2011-03-12 NOTE — Progress Notes (Signed)
Post Partum Day 1 Subjective: no complaints, up ad lib, tolerating PO and nl lochia  Objective: Blood pressure 119/62, pulse 78, temperature 98.7 F (37.1 C), temperature source Oral, resp. rate 20, height 5\' 4"  (1.626 m), weight 96.163 kg (212 lb), SpO2 98.00%, unknown if currently breastfeeding.  Physical Exam:  General: alert and no distress Lochia: appropriate Uterine Fundus: firm   Assessment/Plan: Plan for discharge tomorrow, Breastfeeding and Lactation consult routine care   LOS: 1 day   [name],Khalis Hittle 03/12/2011, 8:41 AM

## 2011-03-12 NOTE — Anesthesia Postprocedure Evaluation (Signed)
Anesthesia Post Note  Patient: [name]  Procedure(s) Performed: * No procedures listed *  Anesthesia type: Epidural  Patient location: Mother/Baby  Post pain: Pain level controlled  Post assessment: Post-op Vital signs reviewed  Last Vitals:  Filed Vitals:   03/12/11 0549  BP: 119/62  Pulse: 78  Temp: 37.1 C  Resp: 20    Post vital signs: Reviewed  Level of consciousness: awake  Complications: No apparent anesthesia complications

## 2011-03-13 MED ORDER — NALBUPHINE SYRINGE 5 MG/0.5 ML
INJECTION | INTRAMUSCULAR | Status: AC
  Filled 2011-03-13: qty 1

## 2011-03-13 MED ORDER — PRENATAL MULTIVITAMIN CH: 1.0000 | ORAL_TABLET | Freq: Every day | ORAL | Status: DC

## 2011-03-13 MED ORDER — OXYCODONE-ACETAMINOPHEN 5-325 MG PO TABS: 1.0000 | ORAL_TABLET | Freq: Four times a day (QID) | ORAL | Status: AC | PRN

## 2011-03-13 MED ORDER — MEASLES, MUMPS & RUBELLA VAC ~~LOC~~ INJ
0.5000 mL | INJECTION | Freq: Once | SUBCUTANEOUS | Status: AC
  Administered 2011-03-13: 0.5 mL via SUBCUTANEOUS
  Filled 2011-03-13: qty 0.5

## 2011-03-13 MED ORDER — IBUPROFEN 800 MG PO TABS: 800.0000 mg | ORAL_TABLET | Freq: Three times a day (TID) | ORAL | Status: AC | PRN

## 2011-03-13 NOTE — Progress Notes (Signed)
Pt discharged before Sw could see.  Referral reason: history of bipolar. 

## 2011-03-13 NOTE — Progress Notes (Signed)
Post Partum Day 2 Subjective: no complaints, up ad lib, voiding, tolerating PO and nl lochia  Objective: Blood pressure 110/73, pulse 74, temperature 98.3 F (36.8 C), temperature source Oral, resp. rate 20, height 5\' 4"  (1.626 m), weight 96.163 kg (212 lb), SpO2 100.00%, unknown if currently breastfeeding.  Physical Exam:  General: alert and no distress Lochia: appropriate Uterine Fundus: firm   Basename 03/12/11 0525 03/11/11 0745  HGB 11.4* 12.7  HCT 33.3* 37.4    Assessment/Plan: Discharge home and Breastfeeding  D/C with Motrin/Percocet/ PNV, f/u 6 weeks   LOS: 2 days   [name],Philo Kurtz 03/13/2011, 8:32 AM

## 2011-03-13 NOTE — Discharge Summary (Signed)
Obstetric Discharge Summary Reason for Admission: induction of labor Prenatal Procedures: none Intrapartum Procedures: spontaneous vaginal delivery Postpartum Procedures: none Complications-Operative and Postpartum: B labial laceration Hemoglobin  Date Value Range Status  03/12/2011 11.4* 12.0-15.0 (g/dL) Final     HCT  Date Value Range Status  03/12/2011 33.3* 36.0-46.0 (%) Final    Discharge Diagnoses: Term Pregnancy-delivered  Discharge Information: Date: 03/13/2011 Activity: pelvic rest Diet: routine Medications: PNV, Ibuprofen and Percocet Condition: stable Instructions: refer to practice specific booklet Discharge to: home Follow-up Information    Follow up with [name],Hannah Strader, MD. Schedule an appointment as soon as possible for a visit in 6 weeks.   Contact information:   [address] [facility] [address] [place] [address] [phone]          Newborn Data: Live born female  Birth Weight: 6 lb 4.2 oz (2841 g) APGAR: 9, 9  Home with mother.  [name],Celenia Hruska 03/13/2011, 8:52 AM

## 2012-06-23 IMAGING — CT CT HEAD W/O CM
1 series · 16 of 30 positions shown, 20 images · non-contrast
Comparison: None.

CLINICAL DATA: 29-year-old female with dizziness, headache, near-
syncope.

CT HEAD WITHOUT CONTRAST
TECHNIQUE: Contiguous axial images were obtained from the base of
the skull through the vertex without contrast.

[Series 2: head_seq 4.5 h37s st · axial · 0.43mm/px · z∈[-156,-30]mm · 16 of 32 slices shown, 20 images]
[im 2/32  brain]
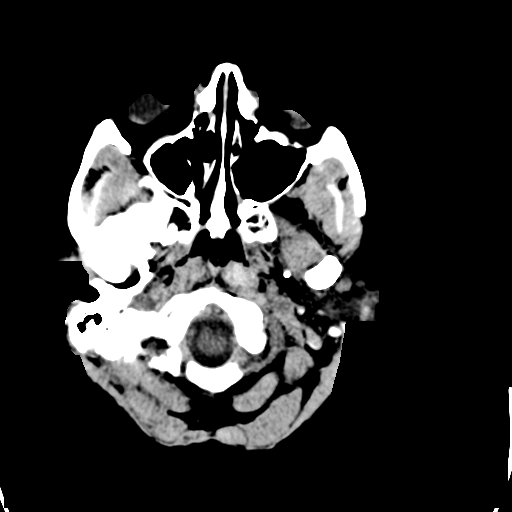
[im 2/32  bone]
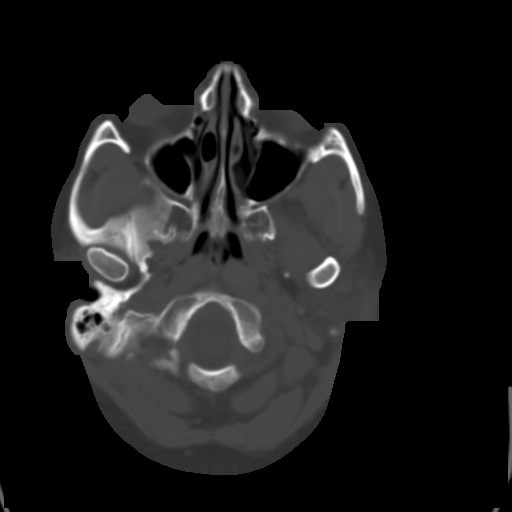
[im 4/32  brain]
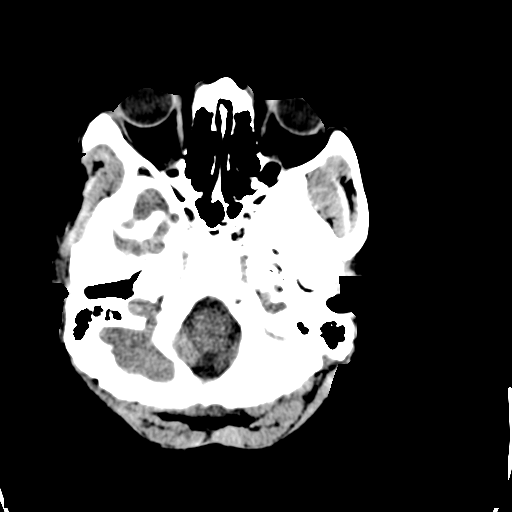
[im 6/32  brain]
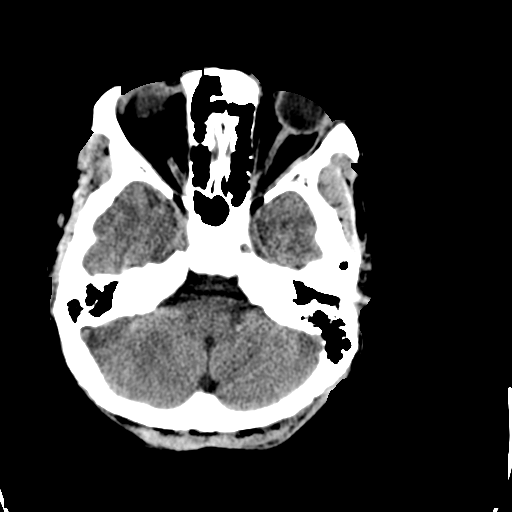
[im 8/32  brain]
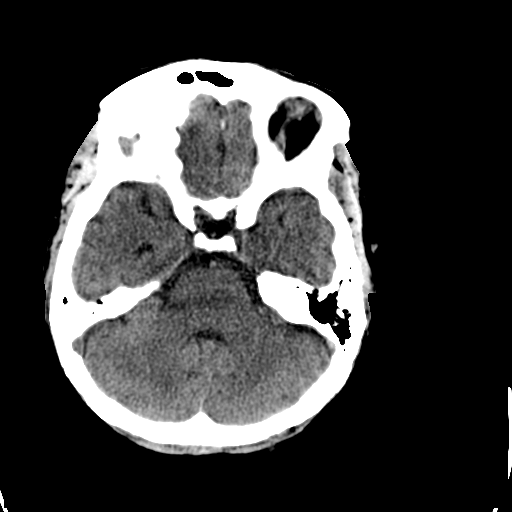
[im 9/32  brain]
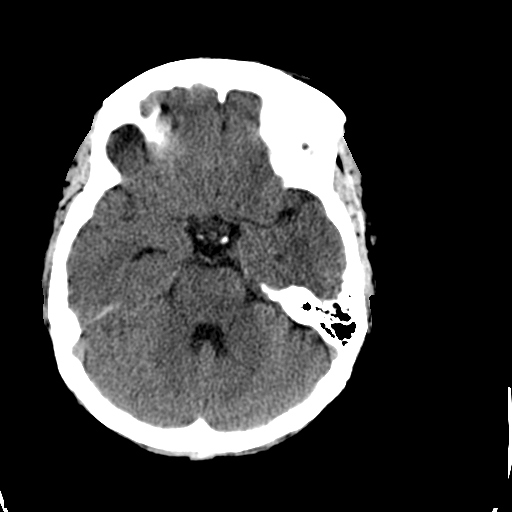
[im 9/32  bone]
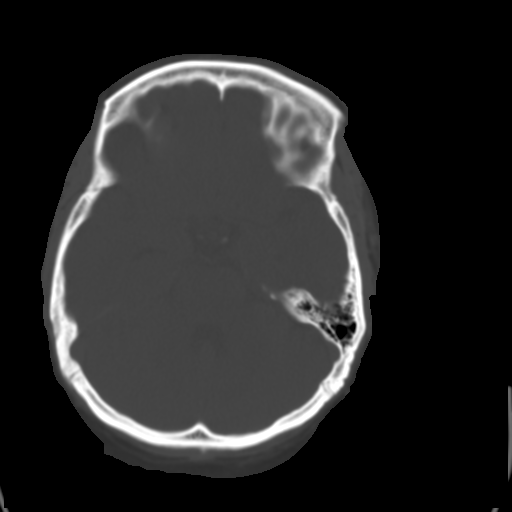
[im 11/32  brain]
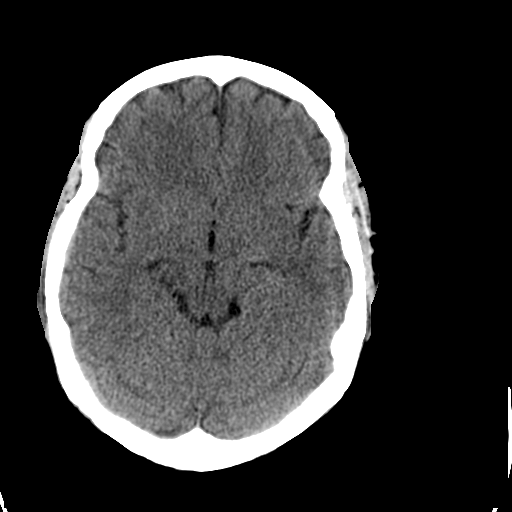
[im 13/32  brain]
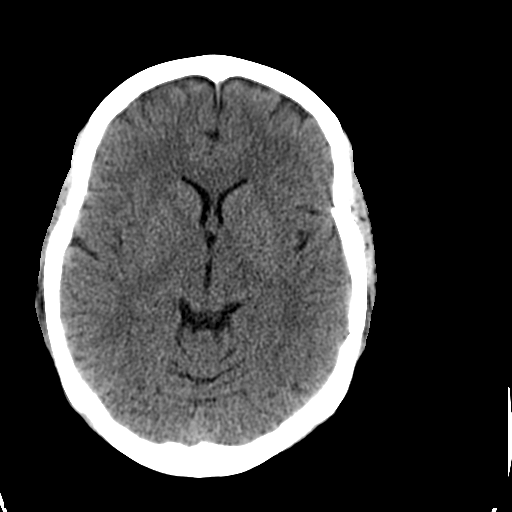
[im 15/32  brain]
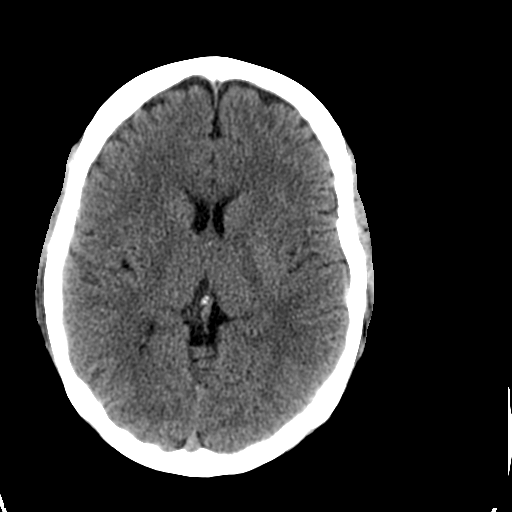
[im 17/32  brain]
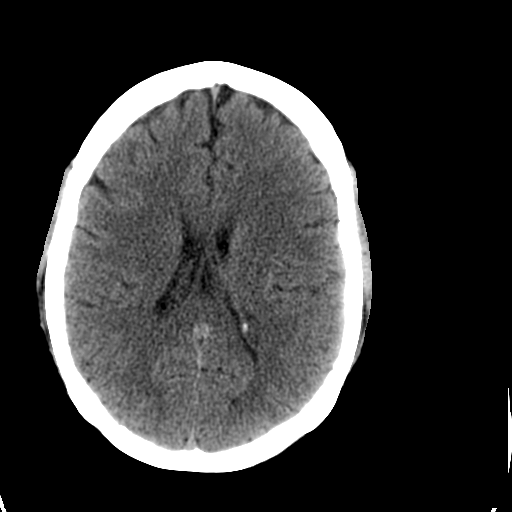
[im 17/32  bone]
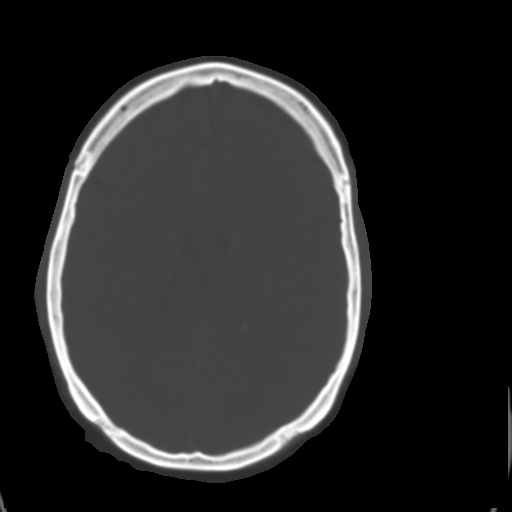
[im 19/32  brain]
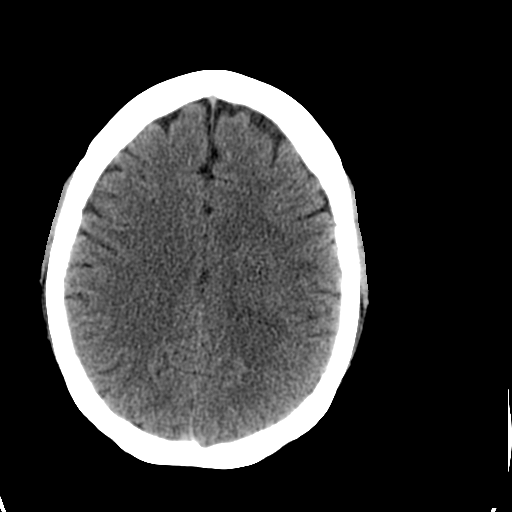
[im 21/32  brain]
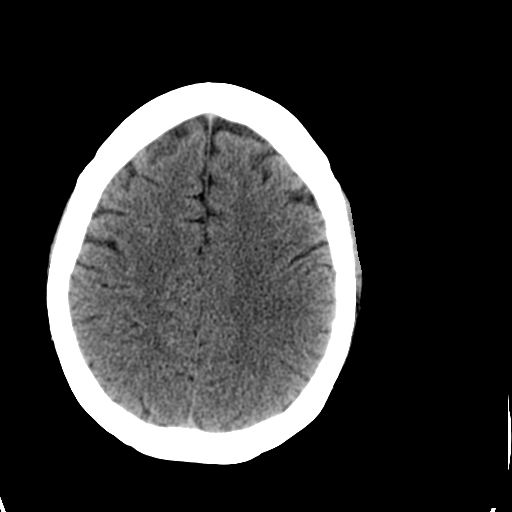
[im 23/32  brain]
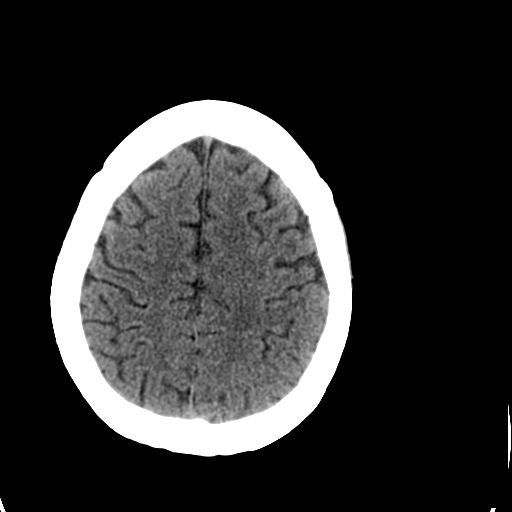
[im 24/32  brain]
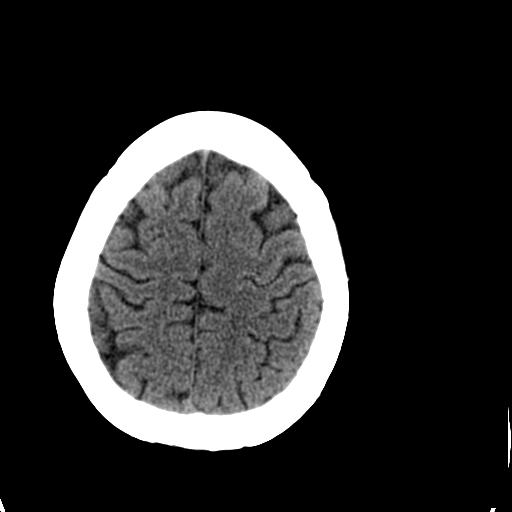
[im 24/32  bone]
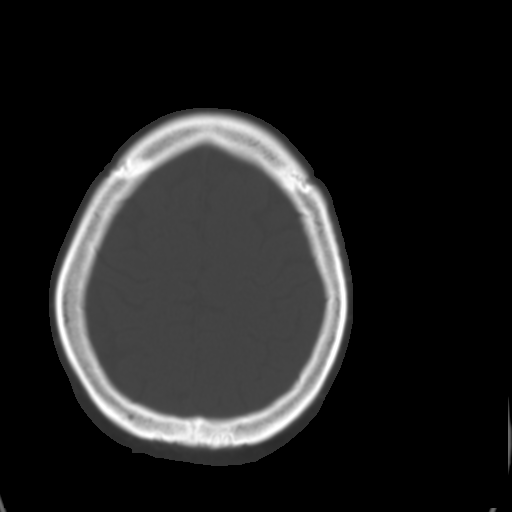
[im 26/32  brain]
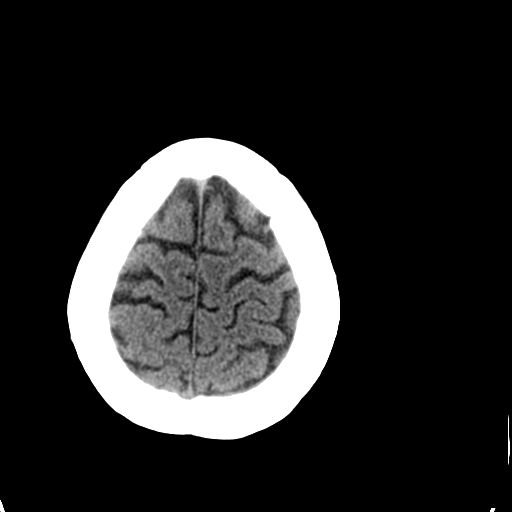
[im 28/32  brain]
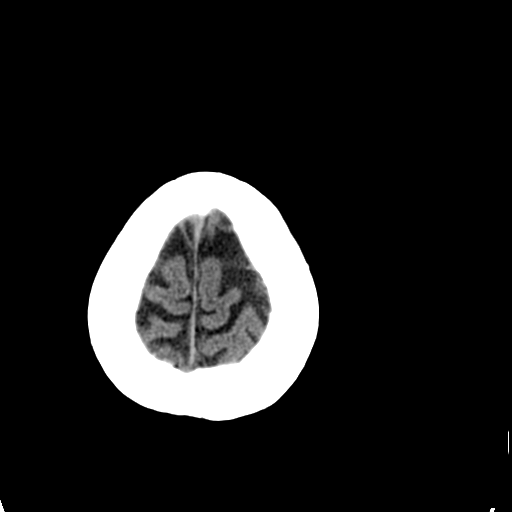
[im 30/32  brain]
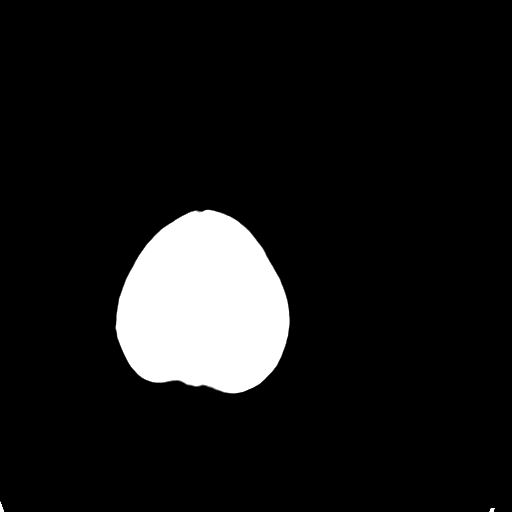

[16 of 30 positions shown; findings below may reference images not displayed]

FINDINGS: Visualized orbits and scalp soft tissues are within
normal limits.  Visualized paranasal sinuses and mastoids are
clear.  No acute osseous abnormality identified.

Cerebral volume is within normal limits for age.  No midline shift,
ventriculomegaly, mass effect, evidence of mass lesion,
intracranial hemorrhage or evidence of cortically based acute
infarction.  Gray-white matter differentiation is within normal
limits throughout the brain.  No suspicious intracranial vascular
hyperdensity.
IMPRESSION: Normal noncontrast CT appearance of the brain.

## 2012-11-11 ENCOUNTER — Emergency Department (HOSPITAL_BASED_OUTPATIENT_CLINIC_OR_DEPARTMENT_OTHER)
Admission: EM | Admit: 2012-11-11 | Discharge: 2012-11-11 | Disposition: A | Discharge status: Home / Self Care | Payer: Worker's Compensation | Attending: Emergency Medicine | Admitting: Emergency Medicine

## 2012-11-11 ENCOUNTER — Encounter (HOSPITAL_BASED_OUTPATIENT_CLINIC_OR_DEPARTMENT_OTHER): Payer: Self-pay | Admitting: *Deleted

## 2012-11-11 DIAGNOSIS — Z87891 Personal history of nicotine dependence: Secondary | ICD-10-CM | POA: Insufficient documentation

## 2012-11-11 DIAGNOSIS — Z8659 Personal history of other mental and behavioral disorders: Secondary | ICD-10-CM | POA: Insufficient documentation

## 2012-11-11 DIAGNOSIS — Y9389 Activity, other specified: Secondary | ICD-10-CM | POA: Insufficient documentation

## 2012-11-11 DIAGNOSIS — Z87440 Personal history of urinary (tract) infections: Secondary | ICD-10-CM | POA: Insufficient documentation

## 2012-11-11 DIAGNOSIS — Y990 Civilian activity done for income or pay: Secondary | ICD-10-CM | POA: Insufficient documentation

## 2012-11-11 DIAGNOSIS — X500XXA Overexertion from strenuous movement or load, initial encounter: Secondary | ICD-10-CM | POA: Insufficient documentation

## 2012-11-11 DIAGNOSIS — Z79899 Other long term (current) drug therapy: Secondary | ICD-10-CM | POA: Insufficient documentation

## 2012-11-11 DIAGNOSIS — Y929 Unspecified place or not applicable: Secondary | ICD-10-CM | POA: Insufficient documentation

## 2012-11-11 DIAGNOSIS — M5432 Sciatica, left side: Secondary | ICD-10-CM

## 2012-11-11 DIAGNOSIS — Z862 Personal history of diseases of the blood and blood-forming organs and certain disorders involving the immune mechanism: Secondary | ICD-10-CM | POA: Insufficient documentation

## 2012-11-11 DIAGNOSIS — X503XXA Overexertion from repetitive movements, initial encounter: Secondary | ICD-10-CM | POA: Insufficient documentation

## 2012-11-11 DIAGNOSIS — Z8639 Personal history of other endocrine, nutritional and metabolic disease: Secondary | ICD-10-CM | POA: Insufficient documentation

## 2012-11-11 DIAGNOSIS — K219 Gastro-esophageal reflux disease without esophagitis: Secondary | ICD-10-CM | POA: Insufficient documentation

## 2012-11-11 DIAGNOSIS — I10 Essential (primary) hypertension: Secondary | ICD-10-CM | POA: Insufficient documentation

## 2012-11-11 DIAGNOSIS — Z3202 Encounter for pregnancy test, result negative: Secondary | ICD-10-CM | POA: Insufficient documentation

## 2012-11-11 DIAGNOSIS — M5430 Sciatica, unspecified side: Secondary | ICD-10-CM | POA: Insufficient documentation

## 2012-11-11 DIAGNOSIS — R209 Unspecified disturbances of skin sensation: Secondary | ICD-10-CM | POA: Insufficient documentation

## 2012-11-11 LAB — URINALYSIS, ROUTINE W REFLEX MICROSCOPIC
Glucose, UA: NEGATIVE mg/dL
Hgb urine dipstick: NEGATIVE
Ketones, ur: NEGATIVE mg/dL
pH: 6 (ref 5.0–8.0)

## 2012-11-11 LAB — URINE MICROSCOPIC-ADD ON

## 2012-11-11 LAB — PREGNANCY, URINE: Preg Test, Ur: NEGATIVE

## 2012-11-11 MED ORDER — KETOROLAC TROMETHAMINE 60 MG/2ML IM SOLN
60.0000 mg | Freq: Once | INTRAMUSCULAR | Status: AC
  Administered 2012-11-11: 60 mg via INTRAMUSCULAR
  Filled 2012-11-11: qty 2

## 2012-11-11 MED ORDER — OXYCODONE-ACETAMINOPHEN 5-325 MG PO TABS: 1.0000 | ORAL_TABLET | Freq: Four times a day (QID) | ORAL | Status: DC | PRN

## 2012-11-11 MED ORDER — PREDNISONE 20 MG PO TABS: 60.0000 mg | ORAL_TABLET | Freq: Every day | ORAL | Status: DC

## 2012-11-11 MED ORDER — IBUPROFEN 800 MG PO TABS: 800.0000 mg | ORAL_TABLET | Freq: Three times a day (TID) | ORAL | Status: DC

## 2012-11-11 MED ORDER — HYDROMORPHONE HCL PF 2 MG/ML IJ SOLN
2.0000 mg | Freq: Once | INTRAMUSCULAR | Status: AC
  Administered 2012-11-11: 2 mg via INTRAMUSCULAR
  Filled 2012-11-11: qty 1

## 2012-11-11 MED ORDER — PREDNISONE 50 MG PO TABS
60.0000 mg | ORAL_TABLET | Freq: Once | ORAL | Status: AC
  Administered 2012-11-11: 60 mg via ORAL
  Filled 2012-11-11: qty 1

## 2012-11-11 NOTE — ED Notes (Signed)
Pt was throwing away trash at work on Sunday and began having lower back pain that radiates down her left leg. Pt sts the pain suddenly became much worse today.

## 2012-11-11 NOTE — ED Provider Notes (Signed)
CSN: [id number]     Arrival date & time 11/11/12  2136 History  This chart was scribed for [name] Mayers, MD by Blanchard Kelch, ED Scribe. The patient was seen in room MH01/MH01. Patient's care was started at 10:03 PM.    Chief Complaint  Patient presents with  . Back Pain   The history is provided by the patient. No language interpreter was used.    HPI Comments: [name] is a 32 y.o. female who presents to the Emergency Department complaining of dull waxing and waning back pain that began three days ago after she did heavy lifting at work. The pain radiates down her left leg. She reports the pain worsened suddenly today while she was holding her daughter. She used heating pads and icy hot yesterday with mild relief at the time. Patient complains of tingling and cold sensations in her feet. Patient denies fevers, dysuria, urinary or bowel incontinence.    Past Medical History  Diagnosis Date  . Depression   . Bipolar 1 disorder   . Hypertension   . UTI (lower urinary tract infection)   . PCOS (polycystic ovarian syndrome)   . GERD (gastroesophageal reflux disease)   . Normal pregnancy 03/11/2011  . SVD (spontaneous vaginal delivery) 03/11/2011   Past Surgical History  Procedure Laterality Date  . Cyst removed      ovarian cyst removed  . Wisdom tooth extraction     No family history on file. History  Substance Use Topics  . Smoking status: Former Smoker -- 15 years    Types: Cigarettes    Quit date: 07/08/2010  . Smokeless tobacco: Never Used  . Alcohol Use: No   OB History   Grav Para Term Preterm Abortions TAB SAB Ect Mult Living   4 2 2       2      Review of Systems: A complete 10 system review of systems was obtained and all systems are negative except as noted in the HPI and PMH.    Allergies  Review of patient's allergies indicates no known allergies.  Home Medications   Current Outpatient Rx  Name  Route  Sig  Dispense  Refill  . Vitamin D,  Ergocalciferol, (DRISDOL) 50000 UNITS CAPS capsule   Oral   Take 50,000 Units by mouth every 7 (seven) days.         . pantoprazole (PROTONIX) 20 MG tablet   Oral   Take 20 mg by mouth daily.           . Prenatal Vit-Fe Fumarate-FA (PRENATAL MULTIVITAMIN) TABS   Oral   Take 1 tablet by mouth at bedtime.           . Prenatal Vit-Fe Fumarate-FA (PRENATAL MULTIVITAMIN) TABS   Oral   Take 1 tablet by mouth at bedtime.   30 tablet   12    Triage Vitals: BP 132/77  Pulse 78  Temp(Src) 98.2 F (36.8 C) (Oral)  Resp 16  Ht 5\' 4"  (1.626 m)  Wt 163 lb (73.936 kg)  BMI 27.97 kg/m2  SpO2 100%  LMP 10/21/2012  Physical Exam  Nursing note and vitals reviewed. Constitutional: She is oriented to person, place, and time. She appears well-developed and well-nourished.  HENT:  Head: Normocephalic and atraumatic.  Eyes: EOM are normal. Pupils are equal, round, and reactive to light.  Neck: Normal range of motion. Neck supple.  Cardiovascular: Normal rate, normal heart sounds and intact distal pulses.   Pulmonary/Chest: Effort  normal and breath sounds normal.  Abdominal: Bowel sounds are normal. She exhibits no distension. There is no tenderness.  Musculoskeletal: Normal range of motion. She exhibits tenderness (diffuse lumbar paraspinal muscles, worse on left). She exhibits no edema.  Neurological: She is alert and oriented to person, place, and time. She has normal strength. She displays normal reflexes. No cranial nerve deficit or sensory deficit.  Skin: Skin is warm and dry. No rash noted.  Psychiatric: She has a normal mood and affect.    ED Course  Procedures (including critical care time)  DIAGNOSTIC STUDIES:  Oxygen Saturation is 100% on room air, normal by my interpretation.    COORDINATION OF CARE:  10:08 PM- Discussed with the patient and all questioned fully answered. She will call me if any problems arise.  . Labs Review Labs Reviewed  URINALYSIS, ROUTINE W  REFLEX MICROSCOPIC - Abnormal; Notable for the following:    APPearance CLOUDY (*)    Leukocytes, UA MODERATE (*)    All other components within normal limits  URINE MICROSCOPIC-ADD ON - Abnormal; Notable for the following:    Squamous Epithelial / LPF FEW (*)    All other components within normal limits  PREGNANCY, URINE   Imaging Review No results found.  MDM   1. Sciatica, left     Pt with sciatica, possibly from lifting. Feeling much better with meds here. Advised rest for a few days and PCP followup for recheck and consideration of advanced imaging, PT, etc.    I personally performed the services described in this documentation, which was scribed in my presence. The recorded information has been reviewed and is accurate.     Lesleigh Hughson B. Boyd Mayers, MD 11/11/12 2310

## 2013-11-03 ENCOUNTER — Other Ambulatory Visit: Payer: Self-pay | Admitting: Obstetrics and Gynecology

## 2013-11-03 DIAGNOSIS — N6459 Other signs and symptoms in breast: Secondary | ICD-10-CM

## 2013-11-09 ENCOUNTER — Ambulatory Visit
Admission: RE | Admit: 2013-11-09 | Discharge: 2013-11-09 | Disposition: A | Discharge status: Home / Self Care | Payer: Commercial Indemnity | Source: Ambulatory Visit | Attending: Obstetrics and Gynecology | Admitting: Obstetrics and Gynecology

## 2013-11-09 ENCOUNTER — Encounter (INDEPENDENT_AMBULATORY_CARE_PROVIDER_SITE_OTHER): Payer: Self-pay

## 2013-11-09 DIAGNOSIS — N6459 Other signs and symptoms in breast: Secondary | ICD-10-CM

## 2014-01-03 ENCOUNTER — Encounter (HOSPITAL_BASED_OUTPATIENT_CLINIC_OR_DEPARTMENT_OTHER): Payer: Self-pay | Admitting: *Deleted

## 2014-10-08 ENCOUNTER — Emergency Department (HOSPITAL_BASED_OUTPATIENT_CLINIC_OR_DEPARTMENT_OTHER): Payer: Commercial Indemnity

## 2014-10-08 ENCOUNTER — Emergency Department (HOSPITAL_BASED_OUTPATIENT_CLINIC_OR_DEPARTMENT_OTHER)
Admission: EM | Admit: 2014-10-08 | Discharge: 2014-10-08 | Disposition: A | Discharge status: Home / Self Care | Payer: Commercial Indemnity | Attending: Emergency Medicine | Admitting: Emergency Medicine

## 2014-10-08 ENCOUNTER — Encounter (HOSPITAL_BASED_OUTPATIENT_CLINIC_OR_DEPARTMENT_OTHER): Payer: Self-pay | Admitting: Emergency Medicine

## 2014-10-08 DIAGNOSIS — Z8659 Personal history of other mental and behavioral disorders: Secondary | ICD-10-CM | POA: Diagnosis not present

## 2014-10-08 DIAGNOSIS — I10 Essential (primary) hypertension: Secondary | ICD-10-CM | POA: Insufficient documentation

## 2014-10-08 DIAGNOSIS — K219 Gastro-esophageal reflux disease without esophagitis: Secondary | ICD-10-CM | POA: Diagnosis not present

## 2014-10-08 DIAGNOSIS — Z79899 Other long term (current) drug therapy: Secondary | ICD-10-CM | POA: Insufficient documentation

## 2014-10-08 DIAGNOSIS — Z3202 Encounter for pregnancy test, result negative: Secondary | ICD-10-CM | POA: Diagnosis not present

## 2014-10-08 DIAGNOSIS — R251 Tremor, unspecified: Secondary | ICD-10-CM | POA: Diagnosis not present

## 2014-10-08 DIAGNOSIS — Z7952 Long term (current) use of systemic steroids: Secondary | ICD-10-CM | POA: Diagnosis not present

## 2014-10-08 DIAGNOSIS — Z791 Long term (current) use of non-steroidal anti-inflammatories (NSAID): Secondary | ICD-10-CM | POA: Diagnosis not present

## 2014-10-08 DIAGNOSIS — R42 Dizziness and giddiness: Secondary | ICD-10-CM | POA: Diagnosis not present

## 2014-10-08 DIAGNOSIS — R064 Hyperventilation: Secondary | ICD-10-CM | POA: Diagnosis not present

## 2014-10-08 DIAGNOSIS — R202 Paresthesia of skin: Secondary | ICD-10-CM | POA: Insufficient documentation

## 2014-10-08 DIAGNOSIS — Z8639 Personal history of other endocrine, nutritional and metabolic disease: Secondary | ICD-10-CM | POA: Insufficient documentation

## 2014-10-08 DIAGNOSIS — M542 Cervicalgia: Secondary | ICD-10-CM | POA: Diagnosis not present

## 2014-10-08 DIAGNOSIS — Z87440 Personal history of urinary (tract) infections: Secondary | ICD-10-CM | POA: Diagnosis not present

## 2014-10-08 DIAGNOSIS — R002 Palpitations: Secondary | ICD-10-CM | POA: Diagnosis not present

## 2014-10-08 DIAGNOSIS — Z87891 Personal history of nicotine dependence: Secondary | ICD-10-CM | POA: Insufficient documentation

## 2014-10-08 DIAGNOSIS — R55 Syncope and collapse: Secondary | ICD-10-CM | POA: Diagnosis present

## 2014-10-08 LAB — URINALYSIS, ROUTINE W REFLEX MICROSCOPIC
BILIRUBIN URINE: NEGATIVE
GLUCOSE, UA: NEGATIVE mg/dL
HGB URINE DIPSTICK: NEGATIVE
KETONES UR: 15 mg/dL — AB
Leukocytes, UA: NEGATIVE
NITRITE: NEGATIVE
PH: 6 (ref 5.0–8.0)
Protein, ur: NEGATIVE mg/dL
SPECIFIC GRAVITY, URINE: 1.01 (ref 1.005–1.030)
Urobilinogen, UA: 1 mg/dL (ref 0.0–1.0)

## 2014-10-08 LAB — BASIC METABOLIC PANEL
ANION GAP: 8 (ref 5–15)
BUN: 9 mg/dL (ref 6–20)
CO2: 25 mmol/L (ref 22–32)
Calcium: 8.8 mg/dL — ABNORMAL LOW (ref 8.9–10.3)
Chloride: 106 mmol/L (ref 101–111)
Creatinine, Ser: 0.64 mg/dL (ref 0.44–1.00)
GFR calc Af Amer: >60 mL/min (ref >60)
GFR calc non Af Amer: >60 mL/min (ref >60)
Glucose, Bld: 86 mg/dL (ref 65–99)
POTASSIUM: 3.4 mmol/L — AB (ref 3.5–5.1)
Sodium: 139 mmol/L (ref 135–145)

## 2014-10-08 LAB — CBC
HCT: 39.4 % (ref 36.0–46.0)
Hemoglobin: 13.3 g/dL (ref 12.0–15.0)
MCH: 30.6 pg (ref 26.0–34.0)
MCHC: 33.8 g/dL (ref 30.0–36.0)
MCV: 90.8 fL (ref 78.0–100.0)
Platelets: 198 10*3/uL (ref 150–400)
RBC: 4.34 MIL/uL (ref 3.87–5.11)
RDW: 13.2 % (ref 11.5–15.5)
WBC: 7.9 10*3/uL (ref 4.0–10.5)

## 2014-10-08 LAB — CBG MONITORING, ED: GLUCOSE-CAPILLARY: 84 mg/dL (ref 65–99)

## 2014-10-08 LAB — PREGNANCY, URINE: Preg Test, Ur: NEGATIVE

## 2014-10-08 MED ORDER — MECLIZINE HCL 25 MG PO TABS: ORAL_TABLET | ORAL | Status: DC

## 2014-10-08 MED ORDER — IBUPROFEN 800 MG PO TABS
800.0000 mg | ORAL_TABLET | Freq: Once | ORAL | Status: AC
  Administered 2014-10-08: 800 mg via ORAL
  Filled 2014-10-08: qty 1

## 2014-10-08 MED ORDER — MECLIZINE HCL 25 MG PO TABS
25.0000 mg | ORAL_TABLET | Freq: Once | ORAL | Status: AC
  Administered 2014-10-08: 25 mg via ORAL
  Filled 2014-10-08: qty 1

## 2014-10-08 NOTE — ED Notes (Addendum)
Pt states when this episode began her lips began to tingle, she began to shake, and she had palpitations. She's had one episode similar to this and it was related to low potassium.

## 2014-10-08 NOTE — ED Provider Notes (Signed)
CSN: [id number]   Arrival date & time 10/08/14 1631  History  This chart was scribed for  Rolland Porter, MD by Bethel Born, ED Scribe. This patient was seen in room MH06/MH06 and the patient's care was started at 5:20 PM.  Chief Complaint  Patient presents with  . Near Syncope    HPI The history is provided by the patient. No language interpreter was used.   [name] is a 34 y.o. female with PMHx of HTN who presents to the Emergency Department complaining of near syncope with sudden onset just PTA at work. The pt was looking at a customer when things started to look "swirly" so she sat on the floor. As she sat down she started hyperventilating.  Associated symptoms include generalized weakness, fluttering palpitations after the onset of dizziness,  tingling at the mouth, tingling at the hands and feet, and tremors. She had another episode of the same symptoms when standing for EMS. Pt denies ear pain, chest pain, and nausea. At present she feels weak but has no palpitations or dizziness. She had a much less severe episode in the past and when she went to the doctor her potassium was low.  Denies known pregnancy but is not on birth control. No daily medication. No history of thyroid disease. No recent illness or infection. Also complains of bilateral neck pain over the last 2 weeks.   Past Medical History  Diagnosis Date  . Depression   . Bipolar 1 disorder   . Hypertension   . UTI (lower urinary tract infection)   . PCOS (polycystic ovarian syndrome)   . GERD (gastroesophageal reflux disease)   . Normal pregnancy 03/11/2011  . SVD (spontaneous vaginal delivery) 03/11/2011    Past Surgical History  Procedure Laterality Date  . Cyst removed      ovarian cyst removed  . Wisdom tooth extraction      History reviewed. No pertinent family history.  History  Substance Use Topics  . Smoking status: Former Smoker -- 15 years    Types: Cigarettes    Quit date: 07/08/2010  . Smokeless  tobacco: Never Used  . Alcohol Use: No     Review of Systems  Constitutional: Negative for fever, chills, diaphoresis, appetite change and fatigue.  HENT: Negative for mouth sores, sore throat and trouble swallowing.   Eyes: Negative for visual disturbance.  Respiratory: Negative for cough, chest tightness, shortness of breath and wheezing.   Cardiovascular: Positive for palpitations. Negative for chest pain.  Gastrointestinal: Negative for nausea, vomiting, abdominal pain, diarrhea and abdominal distention.  Endocrine: Negative for polydipsia, polyphagia and polyuria.  Genitourinary: Negative for dysuria, frequency and hematuria.  Musculoskeletal: Positive for neck pain. Negative for gait problem.  Skin: Negative for color change, pallor and rash.  Neurological: Positive for dizziness and tremors. Negative for syncope, light-headedness and headaches.       Tingling at the mouth, hands, and feet  Hematological: Does not bruise/bleed easily.  Psychiatric/Behavioral: Negative for behavioral problems and confusion.     Home Medications   Prior to Admission medications   Medication Sig Start Date End Date Taking? Authorizing Provider  ibuprofen (ADVIL,MOTRIN) 800 MG tablet Take 1 tablet (800 mg total) by mouth 3 (three) times daily. 11/11/12   Susy Frizzle, MD  oxyCODONE-acetaminophen (PERCOCET/ROXICET) 5-325 MG per tablet Take 1-2 tablets by mouth every 6 (six) hours as needed for pain. 11/11/12   Susy Frizzle, MD  pantoprazole (PROTONIX) 20 MG tablet Take 20 mg by mouth  daily.      Historical Provider, MD  predniSONE (DELTASONE) 20 MG tablet Take 3 tablets (60 mg total) by mouth daily. 11/11/12   Susy Frizzle, MD  Prenatal Vit-Fe Fumarate-FA (PRENATAL MULTIVITAMIN) TABS Take 1 tablet by mouth at bedtime.      Historical Provider, MD  Prenatal Vit-Fe Fumarate-FA (PRENATAL MULTIVITAMIN) TABS Take 1 tablet by mouth at bedtime. 03/13/11   Sherian Rein, MD  Vitamin D,  Ergocalciferol, (DRISDOL) 50000 UNITS CAPS capsule Take 50,000 Units by mouth every 7 (seven) days.    Historical Provider, MD    Allergies  Review of patient's allergies indicates no known allergies.  Triage Vitals: BP 120/73 mmHg  Pulse 77  Temp(Src) 98.8 F (37.1 C) (Oral)  Resp 18  SpO2 100%  LMP 09/05/2014  Physical Exam  Constitutional: She is oriented to person, place, and time. She appears well-developed and well-nourished. No distress.  HENT:  Head: Normocephalic.  Eyes: Conjunctivae are normal. Pupils are equal, round, and reactive to light. No scleral icterus.  Neck: Normal range of motion. Neck supple. No thyromegaly present.  Cardiovascular: Normal rate and regular rhythm.  Exam reveals no gallop and no friction rub.   No murmur heard. Pulmonary/Chest: Effort normal and breath sounds normal. No respiratory distress. She has no wheezes. She has no rales.  Abdominal: Soft. Bowel sounds are normal. She exhibits no distension. There is no tenderness. There is no rebound.  Musculoskeletal: Normal range of motion.  Neurological: She is alert and oriented to person, place, and time.  No nystagmus  Skin: Skin is warm and dry. No rash noted.  Psychiatric: She has a normal mood and affect. Her behavior is normal.    ED Course  Procedures   DIAGNOSTIC STUDIES: Oxygen Saturation is 100% on RA, normal by my interpretation.    COORDINATION OF CARE: 5:28 PM Discussed treatment plan which includes CT head without contrast, EKG, and lab work with pt at bedside and pt agreed to plan.  Labs Review-  Labs Reviewed  BASIC METABOLIC PANEL - Abnormal; Notable for the following:    Potassium 3.4 (*)    Calcium 8.8 (*)    All other components within normal limits  URINALYSIS, ROUTINE W REFLEX MICROSCOPIC (NOT AT [facility]) - Abnormal; Notable for the following:    Ketones, ur 15 (*)    All other components within normal limits  CBC  PREGNANCY, URINE  CBG MONITORING, ED     Imaging Review No results found.  EKG Interpretation  Date/Time:    Ventricular Rate:    PR Interval:    QRS Duration:   QT Interval:    QTC Calculation:   R Axis:     Text Interpretation:         MDM   Final diagnoses:  Vertigo  Hyperventilation    Remains asymptomatic here. Describes fairly classic episode of acute peripheral vertigo without recurrence here. Plan is home. Avoid driving until 24 hours of symptoms. Medications until 24 hours without symptoms. ER with any changes or evolving symptoms.  I personally performed the services described in this documentation, which was scribed in my presence. The recorded information has been reviewed and is accurate.     Rolland Porter, MD 10/09/14 2138

## 2014-10-08 NOTE — Discharge Instructions (Signed)
Benign Positional Vertigo Vertigo means you feel like you or your surroundings are moving when they are not. Benign positional vertigo is the most common form of vertigo. Benign means that the cause of your condition is not serious. Benign positional vertigo is more common in older adults. CAUSES  Benign positional vertigo is the result of an upset in the labyrinth system. This is an area in the middle ear that helps control your balance. This may be caused by a viral infection, head injury, or repetitive motion. However, often no specific cause is found. SYMPTOMS  Symptoms of benign positional vertigo occur when you move your head or eyes in different directions. Some of the symptoms may include:  Loss of balance and falls.  Vomiting.  Blurred vision.  Dizziness.  Nausea.  Involuntary eye movements (nystagmus). DIAGNOSIS  Benign positional vertigo is usually diagnosed by physical exam. If the specific cause of your benign positional vertigo is unknown, your caregiver may perform imaging tests, such as magnetic resonance imaging (MRI) or computed tomography (CT). TREATMENT  Your caregiver may recommend movements or procedures to correct the benign positional vertigo. Medicines such as meclizine, benzodiazepines, and medicines for nausea may be used to treat your symptoms. In rare cases, if your symptoms are caused by certain conditions that affect the inner ear, you may need surgery. HOME CARE INSTRUCTIONS   Follow your caregiver's instructions.  Move slowly. Do not make sudden body or head movements.  Avoid driving.  Avoid operating heavy machinery.  Avoid performing any tasks that would be dangerous to you or others during a vertigo episode.  Drink enough fluids to keep your urine clear or pale yellow. SEEK IMMEDIATE MEDICAL CARE IF:   You develop problems with walking, weakness, numbness, or using your arms, hands, or legs.  You have difficulty speaking.  You develop  severe headaches.  Your nausea or vomiting continues or gets worse.  You develop visual changes.  Your family or friends notice any behavioral changes.  Your condition gets worse.  You have a fever.  You develop a stiff neck or sensitivity to light. MAKE SURE YOU:   Understand these instructions.  Will watch your condition.  Will get help right away if you are not doing well or get worse. Document Released: 11/26/2005 Document Revised: 05/13/2011 Document Reviewed: 11/08/2010 [facility] Patient Information 2015 [place], [place]. This information is not intended to replace advice given to you by your health care provider. Make sure you discuss any questions you have with your health care provider.  Hyperventilation Hyperventilation is breathing that is deeper and more rapid than normal. It is usually associated with panic and anxiety. Hyperventilation can make you feel breathless. It is sometimes called overbreathing. Breathing out too much causes a decrease in the amount of carbon dioxide gas in the blood. This leads to tingling and numbness in the hands, feet, and around the mouth. If this continues, your fingers, hands, and toes may begin to spasm. Hyperventilation usually lasts 20-30 minutes and can be associated with other symptoms of panic and anxiety, including:   Chest pains or tightness.  A pounding or irregular, racing heartbeat (palpitations).  Dizziness.  Lightheadedness.  Dry mouth.  Weakness.  Confusion.  Sleep disturbance. CAUSES  Sudden onset (acute) hyperventilation is usually triggered by acute stress, anxiety, or emotional upset. Long-term (chronic) and recurring hyperventilation can occur with chronic lung problems, such emphysema or asthma. Other causes include:   Nervousness.  Stress.  Stimulant, drug, or alcohol use.  Lung  disease.  Infections, such as pneumonia.  Heart problems.  Severe pain.  Waking from a bad  dream.  Pregnancy.  Bleeding. HOME CARE INSTRUCTIONS  Learn and use breathing exercises that help you breathe from your diaphragm and abdomen.  Practice relaxation techniques to reduce stress, such as visualization, meditation, and muscle release.  During an attack, try breathing into a paper bag. This changes the carbon dioxide level and slows down breathing. SEEK IMMEDIATE MEDICAL CARE IF:  Your hyperventilation continues or gets worse. MAKE SURE YOU:  Understand these instructions.  Will watch your condition.  Will get help right away if you are not doing well or get worse. Document Released: 02/16/2000 Document Revised: 08/20/2011 Document Reviewed: 05/30/2011 [facility] Patient Information 2015 [place], [place]. This information is not intended to replace advice given to you by your health care provider. Make sure you discuss any questions you have with your health care provider.

## 2014-10-08 NOTE — ED Notes (Signed)
Pt was at work, began feeling dizzy and lightheaded, no pain or other sx. Pt sat down on floor. CBG 76, negative orthostatics with EMS. 90 HR 123/87 BP 98% O2 sat RA, unremarkable EKG.

## 2014-11-14 ENCOUNTER — Telehealth: Payer: Self-pay | Admitting: Cardiovascular Disease

## 2014-11-14 NOTE — Telephone Encounter (Signed)
Received fax referral packet from Dr. Malon Kindle, MD for upcoming appointment on 11/30/2014 with Dr. Duke Salvia @ 9:45.  Records given to [facility].  cbr

## 2014-11-29 NOTE — Progress Notes (Signed)
Cardiology Office Note   Date:  11/30/2014   ID:  Linda, Boyd 1980-04-03, MRN [id number]  PCP:  No PCP Per Patient  Cardiologist:   Madilyn Hook, MD   Chief Complaint  Patient presents with  . New Evaluation  . Palpitations  . Loss of Consciousness  . Chest Pain    MORE LIKE PRESSURE  . Shortness of Breath  . Edema    IN THE EVENING MOSTLY      History of Present Illness: [name] is a 34 y.o. female with hypertension and bipolar disorder who presents for syncope and palpitations.  She reports one episode of syncope and another episode of near-syncope. The syncopal episode occurred 2 months ago while at work. She was not feeling particularly stressed or drinking anything strenuous at the time. It started with palpitations and then she became lightheaded and dizzy. After that she felt a rash that went to her head and the next thing she remembers she passed out. There was no post syncopal confusion and reportedly no tonic-clonic movement or loss of bowel or bladder function. That day she went home early from work but was not evaluated medically. 2 weeks later she had a near syncopal episode that felt very similar. She was shaking uncontrollably and coworkers were trying to talk to her but she was unable to get the words out. She felt as though the room was spinning and she was unable to control her hands or legs. Her hands felt numb at the time. She went to the ED, where he was hemodynamically stable but bradycardic to 56 bpm. her potassium was 3.4. She was told that they did not think it was vertigo but they did give a prescription of meclizine to try. She did not use this medication.  Since then [name] continues to have daily episodes that are not severe. She develops palpitations at least 3-4 times a day followed by a rushing sensation to her head and dizziness. She's not had any further syncopal episodes. The palpitations lasted for a few seconds and then she  feels like she is back to normal. She has some shortness of breath now but is currently being treated for bronchitis. In general she does not have shortness of breath. She denies orthopnea or PND, but she does note that she does not sleep well and awakens frequently throughout the night. Her fianc is told her that she started snoring and she did not used to in the past.  She drinks 2-3 cups of coffee daily and 1-2 sodas daily.  [name] previously wasn't alcoholic and smoked heavily for 15 years. She quit drinking in 2010 and quit smoking in 2009. Prior to that she smoked a pack a day for around 10 years.  She exercises by walking 2-3 times per week for 45 minutes and does aerobics one time per week. She does not have dizziness or palpitations with these episodes and denies chest pain or shortness of breath.  Past Medical History  Diagnosis Date  . Depression   . Bipolar 1 disorder   . Hypertension   . UTI (lower urinary tract infection)   . PCOS (polycystic ovarian syndrome)   . GERD (gastroesophageal reflux disease)   . Normal pregnancy 03/11/2011  . SVD (spontaneous vaginal delivery) 03/11/2011    Past Surgical History  Procedure Laterality Date  . Cyst removed      ovarian cyst removed  . Wisdom tooth extraction  No current outpatient prescriptions on file.   No current facility-administered medications for this visit.    Allergies:   Review of patient's allergies indicates no known allergies.    Social History:  The patient  reports that she quit smoking about 4 years ago. Her smoking use included Cigarettes. She quit after 15 years of use. She has never used smokeless tobacco. She reports that she does not drink alcohol or use illicit drugs.   Family History:  The patient's family history includes Diabetes in her mother.    ROS:  Please see the history of present illness.   Otherwise, review of systems are positive for none.   All other systems are reviewed and negative.     PHYSICAL EXAM: VS:  BP 120/74 mmHg  Pulse 70  Ht  (1.626 m)  Wt 74.39 kg (164 lb)  BMI 28.14 kg/m2 , BMI Body mass index is 28.14 kg/(m^2). GENERAL:  Well appearing HEENT:  Pupils equal round and reactive, fundi not visualized, oral mucosa unremarkable NECK:  No jugular venous distention, waveform within normal limits, carotid upstroke brisk and symmetric, no bruits, no thyromegaly LYMPHATICS:  No cervical adenopathy LUNGS:  Clear to auscultation bilaterally HEART:  RRR.  PMI not displaced or sustained,S1 and S2 within normal limits, no S3, no S4, no clicks, no rubs, no murmurs ABD:  Flat, positive bowel sounds normal in frequency in pitch, no bruits, no rebound, no guarding, no midline pulsatile mass, no hepatomegaly, no splenomegaly EXT:  2 plus pulses throughout, no edema, no cyanosis no clubbing SKIN:  No rashes no nodules NEURO:  Cranial nerves II through XII grossly intact, motor grossly intact throughout PSYCH:  Cognitively intact, oriented to person place and time    EKG:  EKG is ordered today. The ekg ordered today demonstrates sinus rhyhtm with sinus arrhythmia at 70 bpm.     Recent Labs: 10/08/2014: BUN 9; Creatinine, Ser 0.64; Hemoglobin 13.3; Platelets 198; Potassium 3.4*; Sodium 139    Lipid Panel No results found for: CHOL, TRIG, HDL, CHOLHDL, VLDL, LDLCALC, LDLDIRECT    Wt Readings from Last 3 Encounters:  11/30/14 74.39 kg (164 lb)  10/08/14 73.936 kg (163 lb)  11/11/12 73.936 kg (163 lb)      Other studies Reviewed: Additional studies/ records that were reviewed today include: . Review of the above records demonstrates:  Please see elsewhere in the note.     ASSESSMENT AND PLAN:  # Palpitations and syncope:  It is unclear what caused [name]' syncopal episodes.  The differential includes vasovagal syncope, atrial or ventricular arrhythmias, othostatic syncope.  She does not appear to have heart failure, but has a history of alcohol abuse, so  we will obtain an echo to rule out depressed LVEF.  Her potassium was low and she reports previously being on potassium supplementation. Given her report of new snoring and insomnia, we will also want to rule out obstructive sleep apnea with associated atrial arrhythmias. She has a high consumption rate of caffeine, which also may be contributing to her symptoms. - TSH, BMP, Mg - Sleep study - Echo - 48H Holter - Orthostatics negative - Patient was advised to lower her caffeine intake after the Holter - Increase intake of non-caffeinated beverages and water.   Current medicines are reviewed at length with the patient today.  The patient does not have concerns regarding medicines.  The following changes have been made:  no change  Labs/ tests ordered today include:   Orders  Placed This Encounter  Procedures  . TSH  . T4, free  . Basic metabolic panel  . Magnesium  . Holter monitor - 48 hour  . EKG 12-Lead  . ECHOCARDIOGRAM COMPLETE  . Split night study     Disposition:   FU with Tiffany C. Duke Salvia, MD in 3 months.    Signed, Madilyn Hook, MD  11/30/2014 10:55 AM    [facility]

## 2014-11-30 ENCOUNTER — Encounter (INDEPENDENT_AMBULATORY_CARE_PROVIDER_SITE_OTHER): Payer: Commercial Indemnity

## 2014-11-30 ENCOUNTER — Ambulatory Visit (INDEPENDENT_AMBULATORY_CARE_PROVIDER_SITE_OTHER): Payer: Commercial Indemnity | Admitting: Cardiovascular Disease

## 2014-11-30 ENCOUNTER — Encounter: Payer: Self-pay | Admitting: Cardiovascular Disease

## 2014-11-30 VITALS — BP 120/74 | HR 70 | Ht 64.0 in | Wt 164.0 lb

## 2014-11-30 DIAGNOSIS — R002 Palpitations: Secondary | ICD-10-CM

## 2014-11-30 DIAGNOSIS — R0683 Snoring: Secondary | ICD-10-CM

## 2014-11-30 DIAGNOSIS — R55 Syncope and collapse: Secondary | ICD-10-CM | POA: Diagnosis not present

## 2014-11-30 DIAGNOSIS — R0602 Shortness of breath: Secondary | ICD-10-CM | POA: Diagnosis not present

## 2014-11-30 DIAGNOSIS — F1010 Alcohol abuse, uncomplicated: Secondary | ICD-10-CM | POA: Diagnosis not present

## 2014-11-30 LAB — BASIC METABOLIC PANEL
BUN: 8 mg/dL (ref 7–25)
CALCIUM: 9.2 mg/dL (ref 8.6–10.2)
CO2: 25 mmol/L (ref 20–31)
Chloride: 107 mmol/L (ref 98–110)
Creat: 0.58 mg/dL (ref 0.50–1.10)
GLUCOSE: 83 mg/dL (ref 65–99)
POTASSIUM: 4.1 mmol/L (ref 3.5–5.3)
SODIUM: 141 mmol/L (ref 135–146)

## 2014-11-30 LAB — MAGNESIUM: MAGNESIUM: 2.2 mg/dL (ref 1.5–2.5)

## 2014-11-30 NOTE — Patient Instructions (Addendum)
Medication Instructions:  NONE  Labwork: Your physician recommends that you return for lab work TODAY.  Testing/Procedures: Your physician has recommended that you wear a holter monitor. Holter monitors are medical devices that record the heart's electrical activity. Doctors most often use these monitors to diagnose arrhythmias. Arrhythmias are problems with the speed or rhythm of the heartbeat. The monitor is a small, portable device. You can wear one while you do your normal daily activities. This is usually used to diagnose what is causing palpitations/syncope (passing out).  Your physician has requested that you have an echocardiogram. Echocardiography is a painless test that uses sound waves to create images of your heart. It provides your doctor with information about the size and shape of your heart and how well your heart's chambers and valves are working. This procedure takes approximately one hour. There are no restrictions for this procedure.  Your physician has recommended that you have a sleep study. This test records several body functions during sleep, including: brain activity, eye movement, oxygen and carbon dioxide blood levels, heart rate and rhythm, breathing rate and rhythm, the flow of air through your mouth and nose, snoring, body muscle movements, and chest and belly movement.  Follow-Up: Dr Duke Salvia recommends that you schedule a follow-up appointment in 3 months.

## 2014-12-01 ENCOUNTER — Telehealth: Payer: Self-pay

## 2014-12-01 LAB — T4, FREE: FREE T4: 0.96 ng/dL (ref 0.80–1.80)

## 2014-12-01 LAB — TSH: TSH: 1.39 u[IU]/mL (ref 0.350–4.500)

## 2014-12-01 NOTE — Telephone Encounter (Signed)
K+ level 3.4  Calcium 8.8

## 2014-12-04 NOTE — Telephone Encounter (Signed)
These labs were from one month ago.  Labs from this encounter are normal.

## 2014-12-06 ENCOUNTER — Telehealth: Payer: Self-pay | Admitting: *Deleted

## 2014-12-06 NOTE — Telephone Encounter (Signed)
Spoke to patient. Result given . Verbalized understanding  

## 2014-12-06 NOTE — Telephone Encounter (Signed)
Pt is calling you back 

## 2014-12-06 NOTE — Telephone Encounter (Signed)
LEFT MESSAGE TO CALL BACK

## 2014-12-06 NOTE — Telephone Encounter (Signed)
-----   Message from Chilton Si, MD sent at 12/04/2014  6:51 PM EDT ----- Normal labs.  Normal thyroid function, electrolytes and kidney function.

## 2014-12-07 ENCOUNTER — Other Ambulatory Visit: Payer: Self-pay

## 2014-12-07 ENCOUNTER — Ambulatory Visit (HOSPITAL_COMMUNITY): Payer: Commercial Indemnity | Attending: Cardiology

## 2014-12-07 DIAGNOSIS — I340 Nonrheumatic mitral (valve) insufficiency: Secondary | ICD-10-CM | POA: Insufficient documentation

## 2014-12-07 DIAGNOSIS — F1010 Alcohol abuse, uncomplicated: Secondary | ICD-10-CM | POA: Diagnosis not present

## 2014-12-07 DIAGNOSIS — R0602 Shortness of breath: Secondary | ICD-10-CM

## 2014-12-07 DIAGNOSIS — Z87891 Personal history of nicotine dependence: Secondary | ICD-10-CM | POA: Insufficient documentation

## 2014-12-07 DIAGNOSIS — I10 Essential (primary) hypertension: Secondary | ICD-10-CM | POA: Insufficient documentation

## 2014-12-07 DIAGNOSIS — I071 Rheumatic tricuspid insufficiency: Secondary | ICD-10-CM | POA: Insufficient documentation

## 2014-12-13 ENCOUNTER — Telehealth: Payer: Self-pay | Admitting: *Deleted

## 2014-12-13 NOTE — Telephone Encounter (Signed)
Spoke to patient.  ECHO Result given . Verbalized understanding  

## 2015-02-20 ENCOUNTER — Encounter (HOSPITAL_BASED_OUTPATIENT_CLINIC_OR_DEPARTMENT_OTHER): Payer: Self-pay

## 2015-03-01 NOTE — Progress Notes (Signed)
This encounter was created in error - please disregard.

## 2015-03-02 ENCOUNTER — Encounter: Payer: Commercial Indemnity | Admitting: Cardiovascular Disease

## 2015-03-03 ENCOUNTER — Encounter: Payer: Self-pay | Admitting: *Deleted

## 2015-03-18 ENCOUNTER — Encounter (HOSPITAL_BASED_OUTPATIENT_CLINIC_OR_DEPARTMENT_OTHER): Payer: Self-pay | Admitting: Emergency Medicine

## 2015-03-18 ENCOUNTER — Emergency Department (HOSPITAL_BASED_OUTPATIENT_CLINIC_OR_DEPARTMENT_OTHER): Payer: Managed Care, Other (non HMO)

## 2015-03-18 ENCOUNTER — Emergency Department (HOSPITAL_BASED_OUTPATIENT_CLINIC_OR_DEPARTMENT_OTHER)
Admission: EM | Admit: 2015-03-18 | Discharge: 2015-03-19 | Disposition: A | Discharge status: Home / Self Care | Payer: Managed Care, Other (non HMO) | Attending: Emergency Medicine | Admitting: Emergency Medicine

## 2015-03-18 DIAGNOSIS — Z8659 Personal history of other mental and behavioral disorders: Secondary | ICD-10-CM | POA: Diagnosis not present

## 2015-03-18 DIAGNOSIS — R1013 Epigastric pain: Secondary | ICD-10-CM | POA: Diagnosis present

## 2015-03-18 DIAGNOSIS — K8020 Calculus of gallbladder without cholecystitis without obstruction: Secondary | ICD-10-CM | POA: Diagnosis not present

## 2015-03-18 DIAGNOSIS — Z87440 Personal history of urinary (tract) infections: Secondary | ICD-10-CM | POA: Insufficient documentation

## 2015-03-18 DIAGNOSIS — Z87891 Personal history of nicotine dependence: Secondary | ICD-10-CM | POA: Diagnosis not present

## 2015-03-18 DIAGNOSIS — Z3202 Encounter for pregnancy test, result negative: Secondary | ICD-10-CM | POA: Insufficient documentation

## 2015-03-18 DIAGNOSIS — R1011 Right upper quadrant pain: Secondary | ICD-10-CM

## 2015-03-18 DIAGNOSIS — I10 Essential (primary) hypertension: Secondary | ICD-10-CM | POA: Diagnosis not present

## 2015-03-18 DIAGNOSIS — Z8639 Personal history of other endocrine, nutritional and metabolic disease: Secondary | ICD-10-CM | POA: Diagnosis not present

## 2015-03-18 LAB — COMPREHENSIVE METABOLIC PANEL
ALBUMIN: 3.7 g/dL (ref 3.5–5.0)
ALT: 15 U/L (ref 14–54)
AST: 16 U/L (ref 15–41)
Alkaline Phosphatase: 68 U/L (ref 38–126)
Anion gap: 5 (ref 5–15)
BUN: 7 mg/dL (ref 6–20)
CHLORIDE: 108 mmol/L (ref 101–111)
CO2: 24 mmol/L (ref 22–32)
CREATININE: 0.72 mg/dL (ref 0.44–1.00)
Calcium: 8.5 mg/dL — ABNORMAL LOW (ref 8.9–10.3)
GFR calc non Af Amer: >60 mL/min (ref >60)
Glucose, Bld: 83 mg/dL (ref 65–99)
Potassium: 3.5 mmol/L (ref 3.5–5.1)
SODIUM: 137 mmol/L (ref 135–145)
Total Bilirubin: 0.1 mg/dL — ABNORMAL LOW (ref 0.3–1.2)
Total Protein: 6.8 g/dL (ref 6.5–8.1)

## 2015-03-18 LAB — CBC
HCT: 39 % (ref 36.0–46.0)
Hemoglobin: 13 g/dL (ref 12.0–15.0)
MCH: 30.2 pg (ref 26.0–34.0)
MCHC: 33.3 g/dL (ref 30.0–36.0)
MCV: 90.5 fL (ref 78.0–100.0)
PLATELETS: 200 10*3/uL (ref 150–400)
RBC: 4.31 MIL/uL (ref 3.87–5.11)
RDW: 12.4 % (ref 11.5–15.5)
WBC: 7.1 10*3/uL (ref 4.0–10.5)

## 2015-03-18 LAB — URINALYSIS, ROUTINE W REFLEX MICROSCOPIC
Bilirubin Urine: NEGATIVE
Glucose, UA: NEGATIVE mg/dL
Hgb urine dipstick: NEGATIVE
Ketones, ur: NEGATIVE mg/dL
LEUKOCYTES UA: NEGATIVE
NITRITE: NEGATIVE
Protein, ur: NEGATIVE mg/dL
SPECIFIC GRAVITY, URINE: 1.017 (ref 1.005–1.030)
pH: 5.5 (ref 5.0–8.0)

## 2015-03-18 LAB — PREGNANCY, URINE: Preg Test, Ur: NEGATIVE

## 2015-03-18 LAB — LIPASE, BLOOD: LIPASE: 27 U/L (ref 11–51)

## 2015-03-18 NOTE — ED Notes (Signed)
Patient states that she has had epigastric pain x 3 weeks off and on. Today she has the same pain but it has been constant. Denies and V/D

## 2015-03-18 NOTE — ED Provider Notes (Signed)
By signing my name below, I, [name], attest that this documentation has been prepared under the direction and in the presence of Ronney Honeywell N Mackayla Mullins, DO. Electronically Signed: Bethel BornBritney Boyd, ED Scribe. 03/19/2015. 12:50 AM.  TIME SEEN: 11:19 PM  CHIEF COMPLAINT: abdominal pain  HPI: [name] is a 35 y.o. female who presents to the Emergency Department complaining of intermittent, non-radiating, 7/10 in severity, heavy/dull/sharp/burning, epigastric pain with onset 3 weeks ago. The pain is typically triggered eating. The most recent episode started 1 hour after eating rice with sausage and peppers. An antacid provided no relief in pain tonight. Application of pressure improves the pain. Denies radiation of pain.  Associated symptoms include nausea. Pt denies fever, chills, vomiting, diarrhea, hematochezia, black/tarry stool, dysuria, abnormal vaginal discharge, abnormal vaginal bleeding, and hematuria. She has no history of abdominal surgery. She uses ibuprofen occasionally. Denies heavy alcohol use. Her mother has history of gallbladder disease. Pt has no PCP.  LNMP was 3 weeks ago.   ROS: See HPI Constitutional: no fever  Eyes: no drainage  ENT: no runny nose   Cardiovascular:  no chest pain  Resp: no SOB  GI: no vomiting GU: no dysuria Integumentary: no rash  Allergy: no hives  Musculoskeletal: no leg swelling  Neurological: no slurred speech ROS otherwise negative  PAST MEDICAL HISTORY/PAST SURGICAL HISTORY:  Past Medical History  Diagnosis Date  . Depression   . Bipolar 1 disorder (HCC)   . Hypertension   . UTI (lower urinary tract infection)   . PCOS (polycystic ovarian syndrome)   . GERD (gastroesophageal reflux disease)   . Normal pregnancy 03/11/2011  . SVD (spontaneous vaginal delivery) 03/11/2011    MEDICATIONS:  Prior to Admission medications   Not on File    ALLERGIES:  No Known Allergies  SOCIAL HISTORY:  Social History  Substance Use Topics  .  Smoking status: Former Smoker -- 15 years    Types: Cigarettes    Quit date: 07/08/2010  . Smokeless tobacco: Never Used  . Alcohol Use: No    FAMILY HISTORY: Family History  Problem Relation Age of Onset  . Diabetes Mother   . Heart disease Mother     EXAM: BP 125/81 mmHg  Pulse 81  Temp(Src) 98.3 F (36.8 C) (Oral)  Resp 18  Ht 5\' 4"  (1.626 m)  Wt 160 lb (72.576 kg)  BMI 27.45 kg/m2  SpO2 100%  LMP 02/15/2015 CONSTITUTIONAL: Alert and oriented and responds appropriately to questions. Well-appearing; well-nourished HEAD: Normocephalic EYES: Conjunctivae clear, PERRL ENT: normal nose; no rhinorrhea; moist mucous membranes; pharynx without lesions noted NECK: Supple, no meningismus, no LAD  CARD: RRR; S1 and S2 appreciated; no murmurs, no clicks, no rubs, no gallops RESP: Normal chest excursion without splinting or tachypnea; breath sounds clear and equal bilaterally; no wheezes, no rhonchi, no rales, no hypoxia or respiratory distress, speaking full sentences ABD/GI: Normal bowel sounds; non-distended; soft, Tender in RUQ with a negative Murphy's sign, no rebound, no guarding, no peritoneal signs BACK:  The back appears normal and is non-tender to palpation, there is no CVA tenderness EXT: Normal ROM in all joints; non-tender to palpation; no edema; normal capillary refill; no cyanosis, no calf tenderness or swelling    SKIN: Normal color for age and race; warm NEURO: Moves all extremities equally, sensation to light touch intact diffusely, cranial nerves II through XII intact PSYCH: The patient's mood and manner are appropriate. Grooming and personal hygiene are appropriate.  MEDICAL DECISION MAKING: Patient  here with right upper quadrant, epigastric abdominal pain. Differential diagnosis includes cholelithiasis, gastritis, peptic ulcer disease, GERD. Less likely pancreatitis given normal lipase. Less likely cholecystitis given no fever, normal white blood cell count and  normal LFTs. We'll obtain a ultrasound of her gallbladder for further evaluation. Patient drove herself to the emergency department. We'll give her Toradol, fluids, Zofran, Protonix and reassess.  ED PROGRESS: Patient's labs again unremarkable. Urine shows no sign of infection and she is not pregnant. Ultrasound shows cholelithiasis without signs of cholecystitis.  Pain is improved with Toradol, Zofran. Will discharge with prescriptions for Vicodin, Zofran. Have provided her with surgery follow-up information. Discussed return precautions. She verbalized understanding and is comfortable with this plan.   I personally performed the services described in this documentation, which was scribed in my presence. The recorded information has been reviewed and is accurate.      Layla Maw Jullisa Grigoryan, DO 03/19/15 [phone]

## 2015-03-19 MED ORDER — KETOROLAC TROMETHAMINE 30 MG/ML IJ SOLN
30.0000 mg | Freq: Once | INTRAMUSCULAR | Status: AC
  Administered 2015-03-19: 30 mg via INTRAVENOUS
  Filled 2015-03-19: qty 1

## 2015-03-19 MED ORDER — SODIUM CHLORIDE 0.9 % IV BOLUS (SEPSIS): 1000.0000 mL | Freq: Once | INTRAVENOUS | Status: DC

## 2015-03-19 MED ORDER — HYDROCODONE-ACETAMINOPHEN 5-325 MG PO TABS
1.0000 | ORAL_TABLET | ORAL | Status: DC | PRN
Start: 2015-03-19 — End: 2015-04-02

## 2015-03-19 MED ORDER — ONDANSETRON 4 MG PO TBDP: 4.0000 mg | ORAL_TABLET | Freq: Three times a day (TID) | ORAL | Status: DC | PRN

## 2015-03-19 MED ORDER — ONDANSETRON HCL 4 MG/2ML IJ SOLN
4.0000 mg | Freq: Once | INTRAMUSCULAR | Status: AC
  Administered 2015-03-19: 4 mg via INTRAVENOUS
  Filled 2015-03-19: qty 2

## 2015-03-19 MED ORDER — PANTOPRAZOLE SODIUM 40 MG IV SOLR
40.0000 mg | Freq: Once | INTRAVENOUS | Status: AC
  Administered 2015-03-19: 40 mg via INTRAVENOUS
  Filled 2015-03-19: qty 40

## 2015-03-19 NOTE — Discharge Instructions (Signed)
Cholelithiasis °Cholelithiasis (also called gallstones) is a form of gallbladder disease in which gallstones form in your gallbladder. The gallbladder is an organ that stores bile made in the liver, which helps digest fats. Gallstones begin as small crystals and slowly grow into stones. Gallstone pain occurs when the gallbladder spasms and a gallstone is blocking the duct. Pain can also occur when a stone passes out of the duct.  °RISK FACTORS °· Being female.   °· Having multiple pregnancies. Health care providers sometimes advise removing diseased gallbladders before future pregnancies.   °· Being obese. °· Eating a diet heavy in fried foods and fat.   °· Being older than 60 years and increasing age.   °· Prolonged use of medicines containing female hormones.   °· Having diabetes mellitus.   °· Rapidly losing weight.   °· Having a family history of gallstones (heredity).   °SYMPTOMS °· Nausea.   °· Vomiting. °· Abdominal pain.   °· Yellowing of the skin (jaundice).   °· Sudden pain. It may persist from several minutes to several hours. °· Fever.   °· Tenderness to the touch.  °In some cases, when gallstones do not move into the bile duct, people have no pain or symptoms. These are called "silent" gallstones.  °TREATMENT °Silent gallstones do not need treatment. In severe cases, emergency surgery may be required. Options for treatment include: °· Surgery to remove the gallbladder. This is the most common treatment. °· Medicines. These do not always work and may take 6-12 months or more to work. °· Shock wave treatment (extracorporeal biliary lithotripsy). In this treatment an ultrasound machine sends shock waves to the gallbladder to break gallstones into smaller pieces that can pass into the intestines or be dissolved by medicine. °HOME CARE INSTRUCTIONS  °· Only take over-the-counter or prescription medicines for pain, discomfort, or fever as directed by your health care provider.   °· Follow a low-fat diet until  seen again by your health care provider. Fat causes the gallbladder to contract, which can result in pain.   °· Follow up with your health care provider as directed. Attacks are almost always recurrent and surgery is usually required for permanent treatment.   °SEEK IMMEDIATE MEDICAL CARE IF:  °· Your pain increases and is not controlled by medicines.   °· You have a fever or persistent symptoms for more than 2-3 days.   °· You have a fever and your symptoms suddenly get worse.   °· You have persistent nausea and vomiting.   °MAKE SURE YOU:  °· Understand these instructions. °· Will watch your condition. °· Will get help right away if you are not doing well or get worse. °  °This information is not intended to replace advice given to you by your health care provider. Make sure you discuss any questions you have with your health care provider. °  °Document Released: 02/14/2005 Document Revised: 10/21/2012 Document Reviewed: 08/12/2012 °Elsevier Interactive Patient Education ©2016 Elsevier Inc. °Low-Fat Diet for Pancreatitis or Gallbladder Conditions °A low-fat diet can be helpful if you have pancreatitis or a gallbladder condition. With these conditions, your pancreas and gallbladder have trouble digesting fats. A healthy eating plan with less fat will help rest your pancreas and gallbladder and reduce your symptoms. °WHAT DO I NEED TO KNOW ABOUT THIS DIET? °· Eat a low-fat diet. °¨ Reduce your fat intake to less than 20-30% of your total daily calories. This is less than 50-60 g of fat per day. °¨ Remember that you need some fat in your diet. Ask your dietician what your daily goal should be. °¨ Choose   nonfat and low-fat healthy foods. Look for the words "nonfat," "low fat," or "fat free." °¨ As a guide, look on the label and choose foods with less than 3 g of fat per serving. Eat only one serving. °· Avoid alcohol. °· Do not smoke. If you need help quitting, talk with your health care provider. °· Eat small  frequent meals instead of three large heavy meals. °WHAT FOODS CAN I EAT? °Grains °Include healthy grains and starches such as potatoes, wheat bread, fiber-rich cereal, and brown rice. Choose whole grain options whenever possible. In adults, whole grains should account for 45-65% of your daily calories.  °Fruits and Vegetables °Eat plenty of fruits and vegetables. Fresh fruits and vegetables add fiber to your diet. °Meats and Other Protein Sources °Eat lean meat such as chicken and pork. Trim any fat off of meat before cooking it. Eggs, fish, and beans are other sources of protein. In adults, these foods should account for 10-35% of your daily calories. °Dairy °Choose low-fat milk and dairy options. Dairy includes fat and protein, as well as calcium.  °Fats and Oils °Limit high-fat foods such as fried foods, sweets, baked goods, sugary drinks.  °Other °Creamy sauces and condiments, such as mayonnaise, can add extra fat. Think about whether or not you need to use them, or use smaller amounts or low fat options. °WHAT FOODS ARE NOT RECOMMENDED? °· High fat foods, such as: °¨ Baked goods. °¨ Ice cream. °¨ French toast. °¨ Sweet rolls. °¨ Pizza. °¨ Cheese bread. °¨ Foods covered with batter, butter, creamy sauces, or cheese. °¨ Fried foods. °¨ Sugary drinks and desserts. °· Foods that cause gas or bloating °  °This information is not intended to replace advice given to you by your health care provider. Make sure you discuss any questions you have with your health care provider. °  °Document Released: 02/23/2013 Document Reviewed: 02/23/2013 °Elsevier Interactive Patient Education ©2016 Elsevier Inc. ° °

## 2015-04-02 ENCOUNTER — Ambulatory Visit (INDEPENDENT_AMBULATORY_CARE_PROVIDER_SITE_OTHER): Payer: Worker's Compensation | Admitting: Family Medicine

## 2015-04-02 ENCOUNTER — Ambulatory Visit: Payer: Worker's Compensation

## 2015-04-02 VITALS — BP 113/78 | HR 72 | Temp 98.8°F | Resp 16 | Ht 64.0 in | Wt 163.0 lb

## 2015-04-02 DIAGNOSIS — M549 Dorsalgia, unspecified: Secondary | ICD-10-CM | POA: Diagnosis not present

## 2015-04-02 DIAGNOSIS — M62830 Muscle spasm of back: Secondary | ICD-10-CM

## 2015-04-02 DIAGNOSIS — M545 Low back pain, unspecified: Secondary | ICD-10-CM

## 2015-04-02 DIAGNOSIS — T148XXA Other injury of unspecified body region, initial encounter: Secondary | ICD-10-CM

## 2015-04-02 DIAGNOSIS — M5440 Lumbago with sciatica, unspecified side: Secondary | ICD-10-CM

## 2015-04-02 MED ORDER — METHYLPREDNISOLONE 4 MG PO TBPK: ORAL_TABLET | ORAL | Status: DC

## 2015-04-02 MED ORDER — CYCLOBENZAPRINE HCL 5 MG PO TABS: 5.0000 mg | ORAL_TABLET | Freq: Three times a day (TID) | ORAL | Status: DC | PRN

## 2015-04-02 MED ORDER — HYDROCODONE-ACETAMINOPHEN 5-325 MG PO TABS: 1.0000 | ORAL_TABLET | Freq: Four times a day (QID) | ORAL | Status: DC | PRN

## 2015-04-02 MED ORDER — METHYLPREDNISOLONE ACETATE 80 MG/ML IJ SUSP
80.0000 mg | Freq: Once | INTRAMUSCULAR | Status: AC
  Administered 2015-04-02: 80 mg via INTRAMUSCULAR

## 2015-04-02 NOTE — Patient Instructions (Signed)
Because you received an x-ray today, you will receive an invoice from [facility]. Please contact [facility] at [phone] with questions or concerns regarding your invoice. Our billing staff will not be able to assist you with those questions. °

## 2015-04-02 NOTE — Progress Notes (Signed)
      Chief Complaint:  Chief Complaint  Patient presents with  . Back Injury    yesterday she slipped twice once on water on the floor next on some melted ice cream  . Back Pain    noticed it locking up on her, hard to move and lift her arm    HPI: [name] is a 35 y.o. female who reports to [facility] today complaining of  Low back pain after slipping on water and then on ice  Yesterday at work, she works at [employer]. She had to roll out of bed, she felt her low back was loacking up when she was leaned over when she taking the vanity. She has low back pain and also mid back paivk. She was trying to catch herself when she was abotu to fall and she caught herself. The first and second time was 4 hours apart . She has had a  Little incontinence, she has not had this since, she has beenurianting fine, has been able to feel everything. She feels that her feet are tingling. She has 8/10 pain , She feels like the longer the day goes on she is stiff. She ahs excruiciating pain with certain ROM. HAs not really tried anythign for this. 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    PHYSICAL EXAM: Filed Vitals:   04/02/15 1426  BP: 113/78  Pulse: 72  Temp: 98.8 F (37.1 C)  Resp: 16   Body mass index is 27.97 kg/(m^2).   General: Alert, mild acute distress HEENT:  Normocephalic, atraumatic, oropharynx patent. EOMI, PERRLA Cardiovascular:  Regular rate and rhythm, no rubs murmurs or gallops.  No Carotid bruits, radial pulse intact. No pedal edema.  Respiratory: Clear to auscultation bilaterally.  No wheezes, rales, or rhonchi.  No  cyanosis, no use of accessory musculature Abdominal: No organomegaly, abdomen is soft and non-tender, positive bowel sounds. No masses. Skin: No rashes. Neurologic: Facial musculature symmetric. Psychiatric: Patient acts appropriately throughout our interaction. Lymphatic: No cervical or submandibular lymphadenopathy Musculoskeletal: Gait intact.  + paramsk tenderness diffuse, poor exam due to pain Decrease ROM 5/5 strength, 2/2 DTRs No saddle anesthesia Straight leg negative Hip and knee exam--normal  LABS:  EKG/XRAY:   Primary read interpreted by Dr. Conley Rolls at [facility] Neg t and l spine    ASSESSMENT/PLAN: Encounter Diagnoses  Name Primary?  . Mid back pain   . Midline low back pain with sciatica, sciatica laterality unspecified   . Sprain and strain Yes  . Muscle spasm of back    Rx medrol dose pack, flexeril and also norco prn  Negative thoracic and also lumbar spine Precautions given to patient if worsening sxs or if she has incontinence Fu in 1 week   Gross sideeffects, risk and benefits, and alternatives of medications d/w patient. Patient is aware that all medications have potential sideeffects and we are unable to predict every sideeffect or drug-drug interaction that may occur.  Thao Le DO  04/09/2015 1:50 PM

## 2015-04-10 ENCOUNTER — Other Ambulatory Visit: Payer: Self-pay | Admitting: General Surgery

## 2015-04-20 ENCOUNTER — Telehealth: Payer: Self-pay | Admitting: Cardiovascular Disease

## 2015-04-20 NOTE — Telephone Encounter (Signed)
[employer] sleep study denied.  Tried to appeal and appeal was also denied. They are advising home study.  Patient is scheduled at [facility] on Sunday 2/19 so this will need to be cancelled. Please let me know if I can do anything else.

## 2015-04-21 NOTE — Telephone Encounter (Signed)
A home study will be fine to start with if that is what her insurance will pay for.  Please cancel the [employer] study.

## 2015-04-21 NOTE — Telephone Encounter (Signed)
Will set up home sleep study.

## 2015-04-21 NOTE — Telephone Encounter (Signed)
Dr Duke Salvia, please advise.

## 2015-04-21 NOTE — Telephone Encounter (Signed)
Cancelled sleep study.   Left a detailed message on patient's mobile number and called patient's mother, [name]). Mother took the message and would relay the message to patient.

## 2015-04-23 ENCOUNTER — Ambulatory Visit (HOSPITAL_BASED_OUTPATIENT_CLINIC_OR_DEPARTMENT_OTHER): Payer: Managed Care, Other (non HMO)

## 2015-04-27 ENCOUNTER — Telehealth: Payer: Self-pay

## 2015-04-27 ENCOUNTER — Other Ambulatory Visit: Payer: Self-pay

## 2015-04-27 NOTE — Telephone Encounter (Signed)
[name] with [facility] to set patient up with a home sleep study.  Will fax requested information (order, patient demographic and insurance information, and office note).  Left detailed message on patient's voicemial (per DPR) notifiying her that someone would be in contact with her from [facility] to set up the home sleep study.

## 2015-05-04 ENCOUNTER — Telehealth: Payer: Self-pay | Admitting: Cardiovascular Disease

## 2015-05-04 NOTE — Telephone Encounter (Signed)
Phone goes to VM. Left msg for [name] to return call.

## 2015-05-04 NOTE — Telephone Encounter (Signed)
New Message  Kathlene November from [facility] needed additional information to complete the order that was sent to him by Dr Tresa Endo. Please call back and discuss.

## 2015-05-04 NOTE — Telephone Encounter (Signed)
Kathlene November called back, left me message.  Needs sleepiness scale, current meds. Wanted to let us know he has been attempting to reach patient and has not heard back yet from patient.

## 2015-05-11 NOTE — Telephone Encounter (Signed)
Forwarded to KeySpanChelle Truitt for completion.

## 2015-05-11 NOTE — Telephone Encounter (Signed)
[facility] [place] [facility].

## 2015-05-16 ENCOUNTER — Ambulatory Visit (INDEPENDENT_AMBULATORY_CARE_PROVIDER_SITE_OTHER): Payer: Worker's Compensation | Admitting: Family Medicine

## 2015-05-16 VITALS — BP 111/74 | HR 57 | Temp 98.3°F | Resp 16 | Ht 64.75 in | Wt 161.0 lb

## 2015-05-16 DIAGNOSIS — M62830 Muscle spasm of back: Secondary | ICD-10-CM | POA: Diagnosis not present

## 2015-05-16 NOTE — Assessment & Plan Note (Signed)
Greatly improved with no residual Sx.  Has reached MMI with no PPI.   - Released back to full work w/o restrictions.

## 2015-05-16 NOTE — Progress Notes (Signed)
   [name] - 35 y.o. female MRN [id number]  Date of birth: January 26, 1981  Linda BrothersChivon E Boyd is a 35 y.o. who presents today for lumbar paraspinal muscle spasm.  Paraspinal lumbar muscle spasm - Previously seen 04/02/15 2/2 fall at work.  Had paraspinal muscle spasms and x-rays done of her lumbar/thoracic spine which were negative for acute fx.  Placed on medrol dose pack as well as norco and flexeril PRN.  Improved after 2-3 weeks and has not been having any problems since then.  Pt denies any current bowel/bladder problems, fever, chills, unintentional weight loss, night time awakenings secondary to pain, weakness in one or both legs   Past Medical History  Diagnosis Date  . Depression   . Bipolar 1 disorder (HCC)   . Hypertension   . UTI (lower urinary tract infection)   . PCOS (polycystic ovarian syndrome)   . GERD (gastroesophageal reflux disease)   . Normal pregnancy 03/11/2011  . SVD (spontaneous vaginal delivery) 03/11/2011  . Allergy   . Asthma   ,  Family History  Problem Relation Age of Onset  . Diabetes Mother   . Heart disease Mother   ,  Social History   Social History  . Marital Status: Legally Separated    Spouse Name: N/A  . Number of Children: N/A  . Years of Education: N/A   Occupational History  . Not on file.   Social History Main Topics  . Smoking status: Former Smoker -- 15 years    Types: Cigarettes    Quit date: 07/08/2010  . Smokeless tobacco: Never Used  . Alcohol Use: No  . Drug Use: No  . Sexual Activity: Yes   Other Topics Concern  . Not on file   Social History Narrative  ,  Past Surgical History  Procedure Laterality Date  . Cyst removed      ovarian cyst removed  . Wisdom tooth extraction      12 point ROS negative other than per HPI.   Physical Exam Filed Vitals:   05/16/15 1503  BP: 111/74  Pulse: 57  Temp: 98.3 F (36.8 C)  Resp: 16    Gen: NAD, AAO 3 Cardio- RRR Pulm - Normal respiratory effort/rate Skin: No  rashes or erythema Extremities: No edema  Vascular: pulses +2 bilateral upper and lower extremity Psych: Normal affect  Neuro: CN 2-12 intact, MS 5/5 B/L UE and LE Back Exam: 1.Gait  1. Walk on heels (L5 root)  Normal   Walk on toes (S1 root)  Normal  2. TTP along Lumbar Vertebrae - Negative  3. Pain with :   1) Extension -Negative   2) Flexion - Negative  4. One Legged Hyperextension for Spondy - Negative  5. Straight Leg Raise - Negative  6. Sitting Leg Raise - Negative  7. DTR - +2/4 Patellar/Achilles 8. MS - 5/5 L2-S1 myotome strength B/L  9. Vascular Exam : DP and PT +2 B/L

## 2015-05-16 NOTE — Patient Instructions (Signed)
     IF you received an x-ray today, you will receive an invoice from [facility]. Please contact [facility] at [phone] with questions or concerns regarding your invoice.   IF you received labwork today, you will receive an invoice from [facility]. Please contact [facility] at [phone] with questions or concerns regarding your invoice.   Our billing staff will not be able to assist you with questions regarding bills from these companies.  You will be contacted with the lab results as soon as they are available. The fastest way to get your results is to activate your My Chart account. Instructions are located on the last page of this paperwork. If you have not heard from us regarding the results in 2 weeks, please contact this office.      

## 2015-06-12 ENCOUNTER — Encounter (HOSPITAL_BASED_OUTPATIENT_CLINIC_OR_DEPARTMENT_OTHER): Payer: Self-pay | Admitting: *Deleted

## 2015-06-12 ENCOUNTER — Emergency Department (HOSPITAL_BASED_OUTPATIENT_CLINIC_OR_DEPARTMENT_OTHER)
Admission: EM | Admit: 2015-06-12 | Discharge: 2015-06-12 | Disposition: A | Discharge status: Home / Self Care | Payer: Managed Care, Other (non HMO) | Attending: Emergency Medicine | Admitting: Emergency Medicine

## 2015-06-12 DIAGNOSIS — Z8639 Personal history of other endocrine, nutritional and metabolic disease: Secondary | ICD-10-CM | POA: Insufficient documentation

## 2015-06-12 DIAGNOSIS — Z87440 Personal history of urinary (tract) infections: Secondary | ICD-10-CM | POA: Diagnosis not present

## 2015-06-12 DIAGNOSIS — Z3202 Encounter for pregnancy test, result negative: Secondary | ICD-10-CM | POA: Diagnosis not present

## 2015-06-12 DIAGNOSIS — J45909 Unspecified asthma, uncomplicated: Secondary | ICD-10-CM | POA: Insufficient documentation

## 2015-06-12 DIAGNOSIS — Z87891 Personal history of nicotine dependence: Secondary | ICD-10-CM | POA: Insufficient documentation

## 2015-06-12 DIAGNOSIS — Z8719 Personal history of other diseases of the digestive system: Secondary | ICD-10-CM | POA: Insufficient documentation

## 2015-06-12 DIAGNOSIS — R51 Headache: Secondary | ICD-10-CM | POA: Insufficient documentation

## 2015-06-12 DIAGNOSIS — I10 Essential (primary) hypertension: Secondary | ICD-10-CM | POA: Insufficient documentation

## 2015-06-12 DIAGNOSIS — Z8659 Personal history of other mental and behavioral disorders: Secondary | ICD-10-CM | POA: Diagnosis not present

## 2015-06-12 DIAGNOSIS — R110 Nausea: Secondary | ICD-10-CM | POA: Diagnosis not present

## 2015-06-12 DIAGNOSIS — R519 Headache, unspecified: Secondary | ICD-10-CM

## 2015-06-12 DIAGNOSIS — H53149 Visual discomfort, unspecified: Secondary | ICD-10-CM | POA: Diagnosis not present

## 2015-06-12 LAB — URINALYSIS, ROUTINE W REFLEX MICROSCOPIC
BILIRUBIN URINE: NEGATIVE
Glucose, UA: NEGATIVE mg/dL
HGB URINE DIPSTICK: NEGATIVE
Ketones, ur: NEGATIVE mg/dL
Leukocytes, UA: NEGATIVE
Nitrite: NEGATIVE
PH: 7 (ref 5.0–8.0)
Protein, ur: NEGATIVE mg/dL
SPECIFIC GRAVITY, URINE: 1.016 (ref 1.005–1.030)

## 2015-06-12 LAB — BASIC METABOLIC PANEL
Anion gap: 5 (ref 5–15)
BUN: 7 mg/dL (ref 6–20)
CALCIUM: 8.6 mg/dL — AB (ref 8.9–10.3)
CHLORIDE: 108 mmol/L (ref 101–111)
CO2: 26 mmol/L (ref 22–32)
CREATININE: 0.61 mg/dL (ref 0.44–1.00)
GFR calc Af Amer: >60 mL/min (ref >60)
GFR calc non Af Amer: >60 mL/min (ref >60)
Glucose, Bld: 89 mg/dL (ref 65–99)
Potassium: 3.3 mmol/L — ABNORMAL LOW (ref 3.5–5.1)
SODIUM: 139 mmol/L (ref 135–145)

## 2015-06-12 LAB — CBC
HEMATOCRIT: 39.4 % (ref 36.0–46.0)
Hemoglobin: 13.3 g/dL (ref 12.0–15.0)
MCH: 30.9 pg (ref 26.0–34.0)
MCHC: 33.8 g/dL (ref 30.0–36.0)
MCV: 91.4 fL (ref 78.0–100.0)
Platelets: 196 10*3/uL (ref 150–400)
RBC: 4.31 MIL/uL (ref 3.87–5.11)
RDW: 13.5 % (ref 11.5–15.5)
WBC: 7.6 10*3/uL (ref 4.0–10.5)

## 2015-06-12 LAB — PREGNANCY, URINE: Preg Test, Ur: NEGATIVE

## 2015-06-12 MED ORDER — PROCHLORPERAZINE EDISYLATE 5 MG/ML IJ SOLN
10.0000 mg | Freq: Once | INTRAMUSCULAR | Status: AC
  Administered 2015-06-12: 10 mg via INTRAVENOUS
  Filled 2015-06-12: qty 2

## 2015-06-12 MED ORDER — DIPHENHYDRAMINE HCL 50 MG/ML IJ SOLN
25.0000 mg | Freq: Once | INTRAMUSCULAR | Status: AC
  Administered 2015-06-12: 25 mg via INTRAVENOUS
  Filled 2015-06-12: qty 1

## 2015-06-12 MED ORDER — KETOROLAC TROMETHAMINE 30 MG/ML IJ SOLN
30.0000 mg | Freq: Once | INTRAMUSCULAR | Status: AC
  Administered 2015-06-12: 30 mg via INTRAVENOUS
  Filled 2015-06-12: qty 1

## 2015-06-12 NOTE — ED Notes (Signed)
Woke with headache and fever. [facility] and told to come here for further.

## 2015-06-12 NOTE — ED Notes (Signed)
Patient leaves with husband in wheelchair, is pushed out by ED tech. Patient is A&Ox4 and reports "feeling better". Patient instructed to go home to rest.

## 2015-06-12 NOTE — Discharge Instructions (Signed)
You were seen and evaluated today for your headache. The exact cause is unclear. At this time it does not seem to be secondary to something life threatening. If you have sudden intense headache that returns return to the emergency department. Rest. Try taking Excedrin migraine for your symptoms.  General Headache Without Cause A headache is pain or discomfort felt around the head or neck area. There are many causes and types of headaches. In some cases, the cause may not be found.  HOME CARE  Managing Pain  Take over-the-counter and prescription medicines only as told by your doctor.  Lie down in a dark, quiet room when you have a headache.  If directed, apply ice to the head and neck area:  Put ice in a plastic bag.  Place a towel between your skin and the bag.  Leave the ice on for 20 minutes, 2-3 times per day.  Use a heating pad or hot shower to apply heat to the head and neck area as told by your doctor.  Keep lights dim if bright lights bother you or make your headaches worse. Eating and Drinking  Eat meals on a regular schedule.  Lessen how much alcohol you drink.  Lessen how much caffeine you drink, or stop drinking caffeine. General Instructions  Keep all follow-up visits as told by your doctor. This is important.  Keep a journal to find out if certain things bring on headaches. For example, write down:  What you eat and drink.  How much sleep you get.  Any change to your diet or medicines.  Relax by getting a massage or doing other relaxing activities.  Lessen stress.  Sit up straight. Do not tighten (tense) your muscles.  Do not use tobacco products. This includes cigarettes, chewing tobacco, or e-cigarettes. If you need help quitting, ask your doctor.  Exercise regularly as told by your doctor.  Get enough sleep. This often means 7-9 hours of sleep. GET HELP IF:  Your symptoms are not helped by medicine.  You have a headache that feels different  than the other headaches.  You feel sick to your stomach (nauseous) or you throw up (vomit).  You have a fever. GET HELP RIGHT AWAY IF:   Your headache becomes really bad.  You keep throwing up.  You have a stiff neck.  You have trouble seeing.  You have trouble speaking.  You have pain in the eye or ear.  Your muscles are weak or you lose muscle control.  You lose your balance or have trouble walking.  You feel like you will pass out (faint) or you pass out.  You have confusion.   This information is not intended to replace advice given to you by your health care provider. Make sure you discuss any questions you have with your health care provider.   Document Released: 11/28/2007 Document Revised: 11/09/2014 Document Reviewed: 06/13/2014 Elsevier Interactive Patient Education [employer] Elsevier Inc.

## 2015-06-12 NOTE — ED Notes (Signed)
Patient provided with several warm blankets per request.

## 2015-06-12 NOTE — ED Provider Notes (Signed)
CSN: [id number]     Arrival date & time 06/12/15  1314 History   First MD Initiated Contact with Patient 06/12/15 1445     Chief Complaint  Patient presents with  . Headache     (Consider location/radiation/quality/duration/timing/severity/associated sxs/prior Treatment) HPI Comments: 35 year old female with history of headaches, PCO S, hypertension presents for headache. The patient reports that she does get intermittent headaches. She says that last night she developed a headache. Since that time it has progressively worsened. She reports that it comes and goes and is sharp in nature. She reports photophobia and nausea. She says that she was told at urgent care that she had a low-grade temperature because it was 100 although no noted says it is 99.14F. She denies any temperatures above 100.88F. Denies any vomiting. No focal weakness or neurologic deficits. Denies neck pain.   Past Medical History  Diagnosis Date  . Depression   . Bipolar 1 disorder (HCC)   . Hypertension   . UTI (lower urinary tract infection)   . PCOS (polycystic ovarian syndrome)   . GERD (gastroesophageal reflux disease)   . Normal pregnancy 03/11/2011  . SVD (spontaneous vaginal delivery) 03/11/2011  . Allergy   . Asthma    Past Surgical History  Procedure Laterality Date  . Cyst removed      ovarian cyst removed  . Wisdom tooth extraction     Family History  Problem Relation Age of Onset  . Diabetes Mother   . Heart disease Mother    Social History  Substance Use Topics  . Smoking status: Former Smoker -- 15 years    Types: Cigarettes    Quit date: 07/08/2010  . Smokeless tobacco: Never Used  . Alcohol Use: No   OB History    Gravida Para Term Preterm AB TAB SAB Ectopic Multiple Living   4 2 2       2      Review of Systems  Constitutional: Negative for fever, chills, appetite change and fatigue.  HENT: Negative for congestion, ear pain and sinus pressure.   Eyes: Positive for photophobia.  Negative for pain.  Respiratory: Negative for cough, chest tightness and shortness of breath.   Cardiovascular: Negative for chest pain and palpitations.  Gastrointestinal: Positive for nausea. Negative for vomiting, abdominal pain and diarrhea.  Genitourinary: Negative for dysuria, urgency and hematuria.  Musculoskeletal: Negative for myalgias, back pain and neck pain.  Skin: Negative for rash.  Neurological: Positive for headaches. Negative for dizziness, seizures, weakness and numbness.  Hematological: Does not bruise/bleed easily.      Allergies  Review of patient's allergies indicates no known allergies.  Home Medications   Prior to Admission medications   Medication Sig Start Date End Date Taking? Authorizing Provider  cyclobenzaprine (FLEXERIL) 5 MG tablet Take 1 tablet (5 mg total) by mouth 3 (three) times daily as needed for muscle spasms. 04/02/15   Thao P Le, DO  HYDROcodone-acetaminophen (NORCO/VICODIN) 5-325 MG tablet Take 1-2 tablets by mouth every 6 (six) hours as needed. May cause constipation, no tylenol with this 04/02/15   Thao P Le, DO  methylPREDNISolone (MEDROL DOSEPAK) 4 MG TBPK tablet Take as directed 04/02/15   Thao P Le, DO  ondansetron (ZOFRAN ODT) 4 MG disintegrating tablet Take 1 tablet (4 mg total) by mouth every 8 (eight) hours as needed for nausea or vomiting. 03/19/15   Kristen N Ward, DO   BP 113/67 mmHg  Pulse 78  Temp(Src) 99 F (37.2 C) (Oral)  Resp 16  Ht  (1.626 m)  Wt 159 lb (72.122 kg)  BMI 27.28 kg/m2  SpO2 99%  LMP 06/08/2015 Physical Exam  Constitutional: She is oriented to person, place, and time. She appears well-developed and well-nourished. No distress.  HENT:  Head: Normocephalic and atraumatic.  Right Ear: External ear normal.  Left Ear: External ear normal.  Nose: Nose normal.  Mouth/Throat: Oropharynx is clear and moist. No oropharyngeal exudate.  Eyes: EOM are normal. Pupils are equal, round, and reactive to light.   Neck: Normal range of motion and full passive range of motion without pain. Neck supple. No spinous process tenderness and no muscular tenderness present.  Cardiovascular: Normal rate, regular rhythm, normal heart sounds and intact distal pulses.   No murmur heard. Pulmonary/Chest: Effort normal. No respiratory distress. She has no wheezes. She has no rales.  Abdominal: Soft. She exhibits no distension. There is no tenderness.  Musculoskeletal: Normal range of motion. She exhibits no edema or tenderness.  Neurological: She is alert and oriented to person, place, and time. She has normal strength. No cranial nerve deficit or sensory deficit. She exhibits normal muscle tone. Coordination and gait normal.  Skin: Skin is warm and dry. No rash noted. She is not diaphoretic.  Vitals reviewed.   ED Course  Procedures (including critical care time) Labs Review Labs Reviewed  BASIC METABOLIC PANEL - Abnormal; Notable for the following:    Potassium 3.3 (*)    Calcium 8.6 (*)    All other components within normal limits  URINALYSIS, ROUTINE W REFLEX MICROSCOPIC (NOT AT [facility])  PREGNANCY, URINE  CBC    Imaging Review No results found. I have personally reviewed and evaluated these images and lab results as part of my medical decision-making.   EKG Interpretation None      MDM  Patient was seen and evaluated in stable condition. No red flag symptoms on history. No focal findings on examination with a benign and normal neurologic examination. Laboratory results were unremarkable. Patient was given IV fluids, Toradol, Benadryl, Compazine. Reevaluation she felt well. She was offered imaging but at this time felt that her symptoms resolved and I felt that it was appropriate to hold imaging at this time. Patient was discharged home in stable condition with strict return precautions. She was instructed to follow-up outpatient. She and her friend at the bedside both expressed understanding and  agreement with plan of care. Final diagnoses:  Nonintractable headache, unspecified chronicity pattern, unspecified headache type    1. Headache    Leta Baptist, MD 06/12/15 [phone]

## 2015-06-14 ENCOUNTER — Telehealth: Payer: Self-pay | Admitting: Cardiovascular Disease

## 2015-06-14 NOTE — Telephone Encounter (Signed)
New message   Kathlene NovemberMike is calling to speak to rn or doctor to report that he has not been able to contact pt  Pt do not return calls, mike has left many voice messages

## 2015-11-10 NOTE — Telephone Encounter (Signed)
Please close Encounter °

## 2015-11-16 ENCOUNTER — Other Ambulatory Visit: Payer: Self-pay | Admitting: Obstetrics and Gynecology

## 2015-11-16 DIAGNOSIS — N6459 Other signs and symptoms in breast: Secondary | ICD-10-CM

## 2015-11-21 ENCOUNTER — Other Ambulatory Visit: Payer: Self-pay | Admitting: Obstetrics and Gynecology

## 2015-11-21 DIAGNOSIS — N6459 Other signs and symptoms in breast: Secondary | ICD-10-CM

## 2015-11-24 ENCOUNTER — Ambulatory Visit
Admission: RE | Admit: 2015-11-24 | Discharge: 2015-11-24 | Disposition: A | Discharge status: Home / Self Care | Payer: Managed Care, Other (non HMO) | Source: Ambulatory Visit | Attending: Obstetrics and Gynecology | Admitting: Obstetrics and Gynecology

## 2015-11-24 DIAGNOSIS — N6459 Other signs and symptoms in breast: Secondary | ICD-10-CM

## 2016-11-19 ENCOUNTER — Other Ambulatory Visit: Payer: Self-pay | Admitting: Obstetrics and Gynecology

## 2016-11-19 DIAGNOSIS — Z1231 Encounter for screening mammogram for malignant neoplasm of breast: Secondary | ICD-10-CM

## 2016-11-29 ENCOUNTER — Ambulatory Visit
Admission: RE | Admit: 2016-11-29 | Discharge: 2016-11-29 | Disposition: A | Discharge status: Home / Self Care | Payer: Managed Care, Other (non HMO) | Source: Ambulatory Visit | Attending: Obstetrics and Gynecology | Admitting: Obstetrics and Gynecology

## 2016-11-29 DIAGNOSIS — Z1231 Encounter for screening mammogram for malignant neoplasm of breast: Secondary | ICD-10-CM

## 2017-04-18 IMAGING — CR DG THORACIC SPINE 2V
4 series · 4 of 4 positions shown · non-contrast
Comparison: None.

CLINICAL DATA: 34-year-old who slipped yesterday and was able to
catch herself before she fell, though she complains of acute mid
back pain and low back pain without radiculopathy. Initial
encounter.

EXAM:
THORACIC SPINE 2 VIEWS

[AP (1 of 2)]
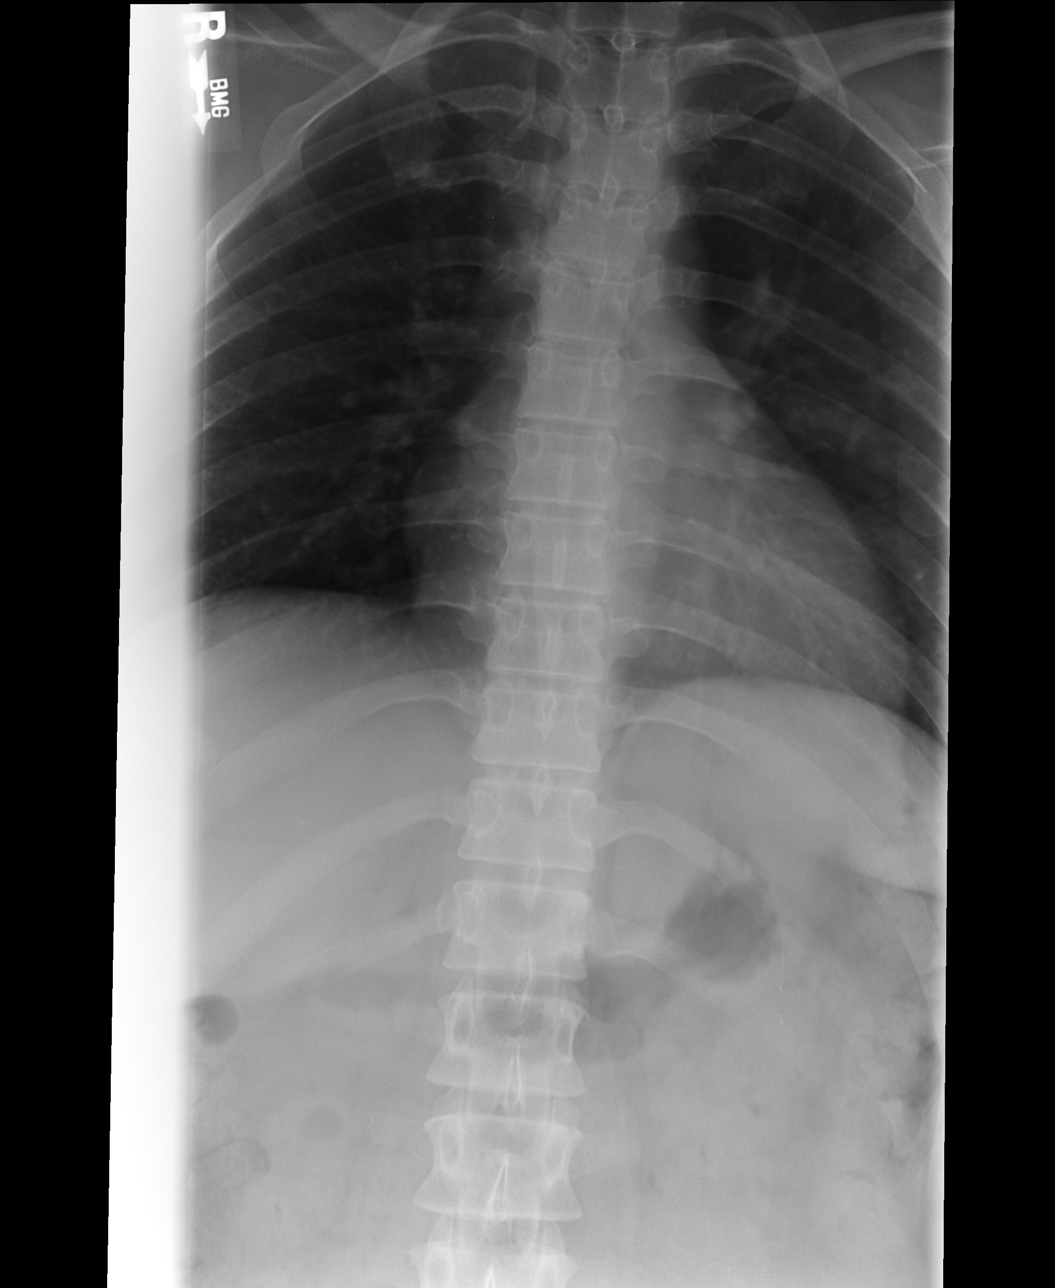

[lateral]
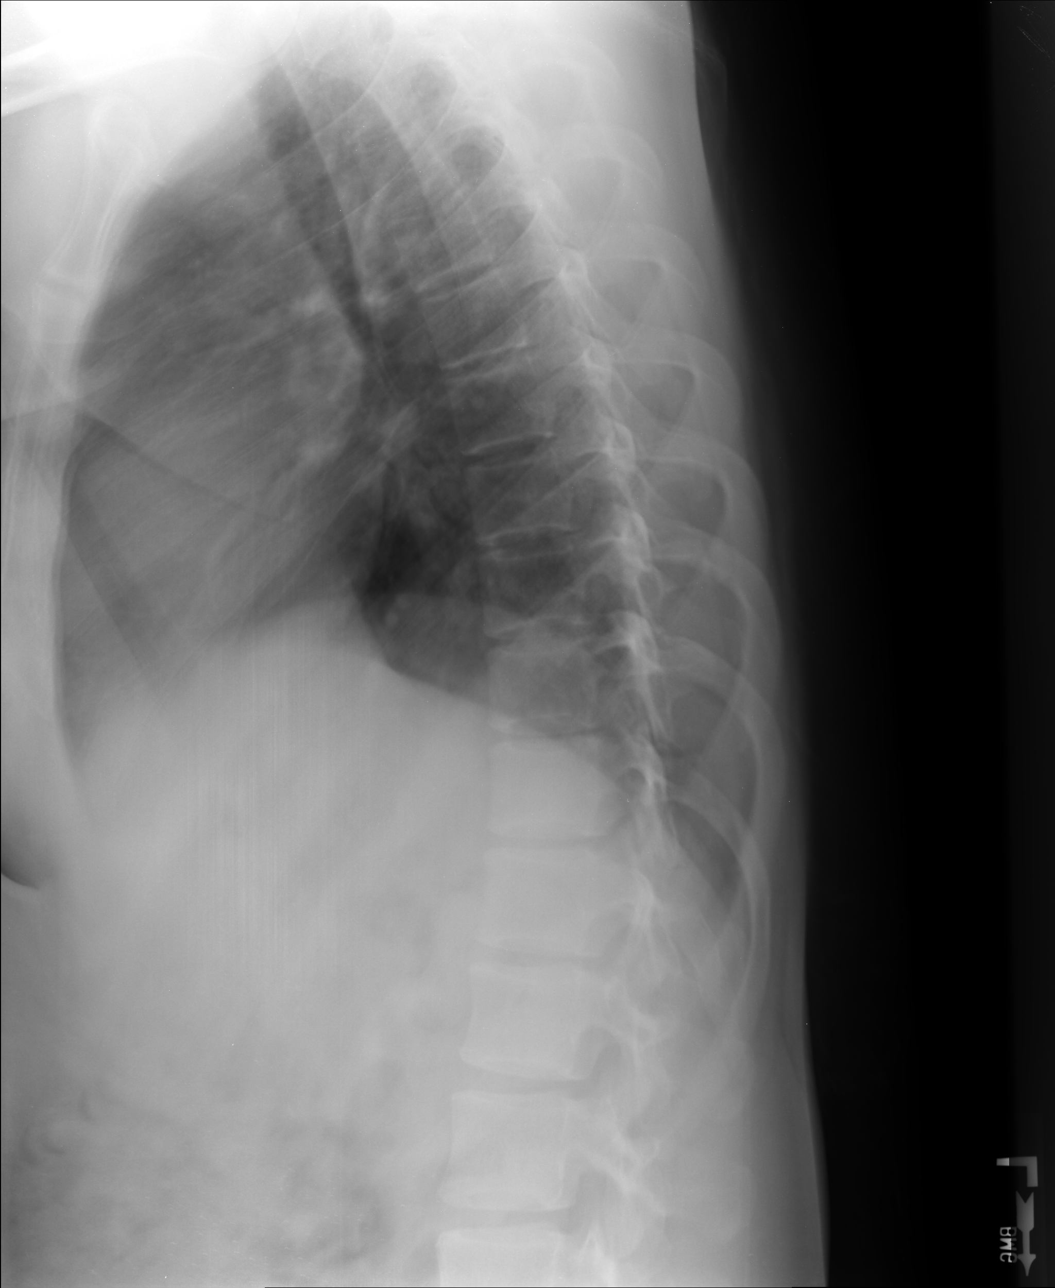

[swimmers]
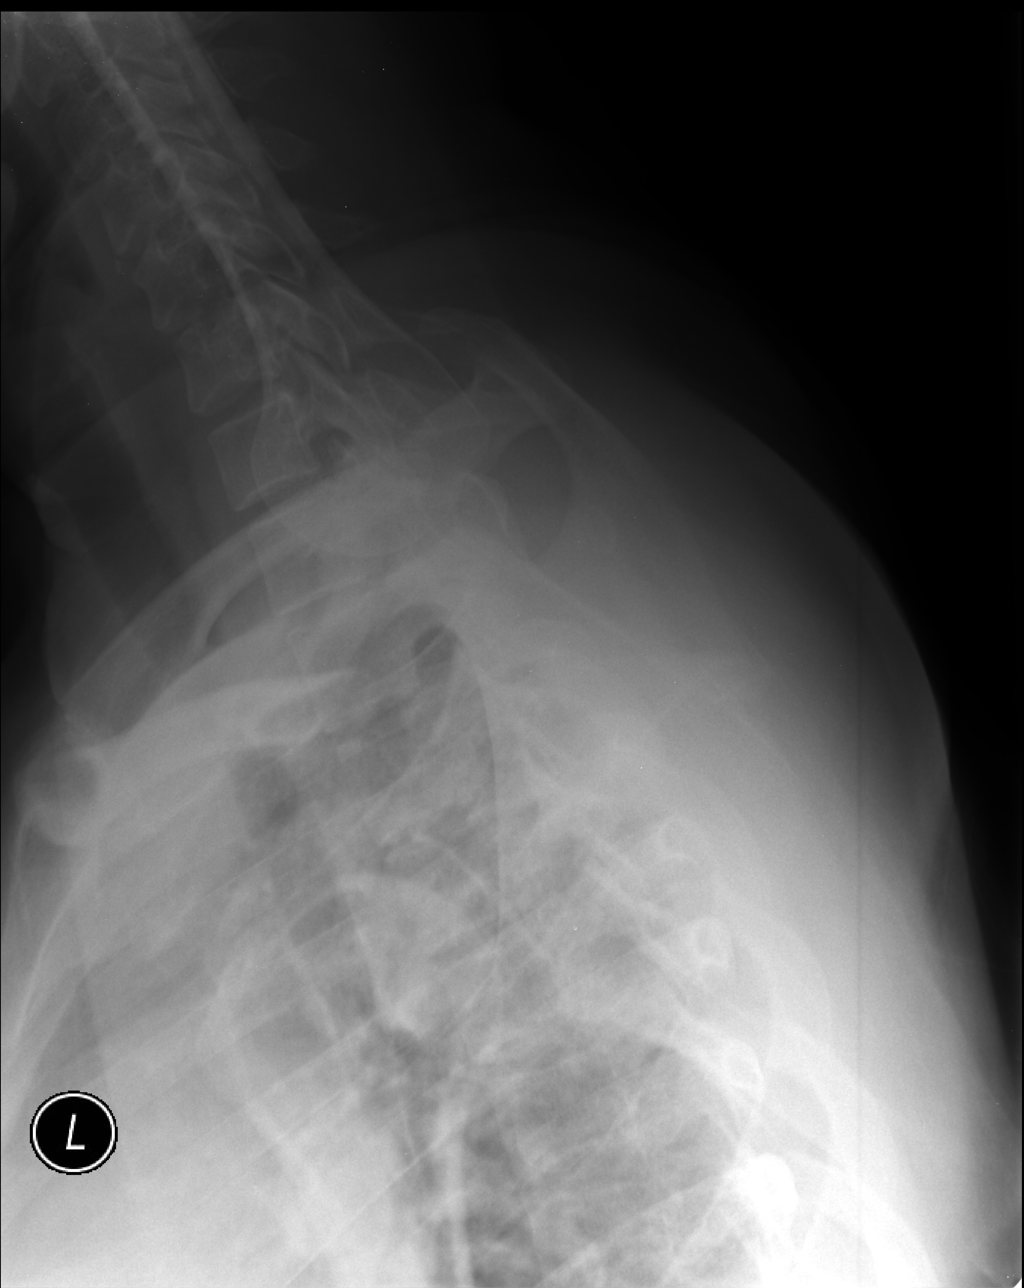

[AP (2 of 2)]
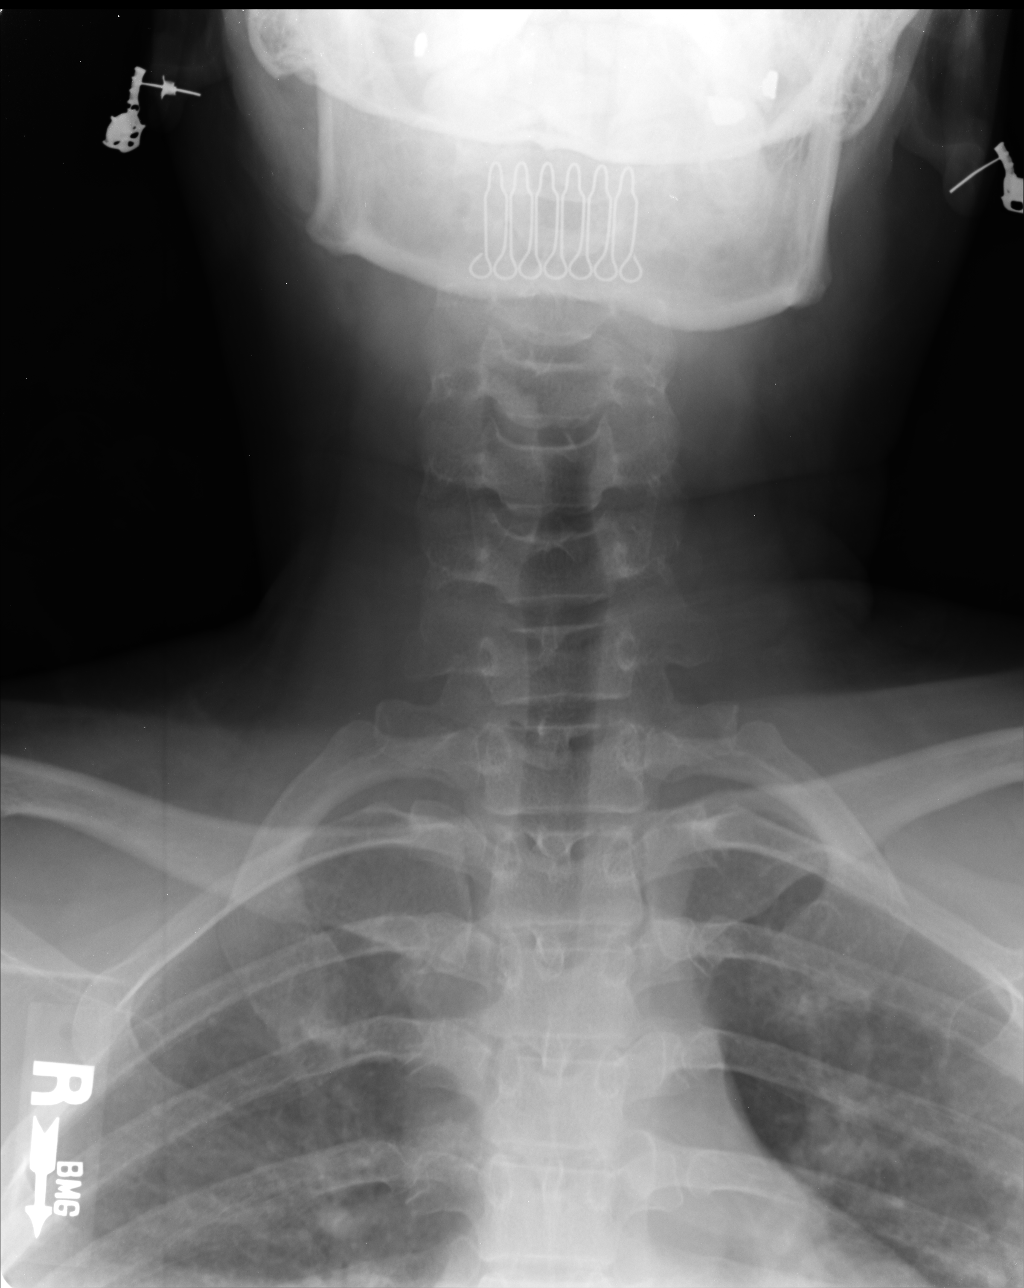

[4 of 4 positions shown; findings below may reference images not displayed]

FINDINGS: Twelve rib-bearing thoracic vertebrae with anatomic alignment. No
fractures. Well-preserved disk spaces. No significant spondylosis.
Intact pedicles.
IMPRESSION: Normal examination.

## 2017-12-19 ENCOUNTER — Other Ambulatory Visit: Payer: Self-pay | Admitting: Physician Assistant

## 2017-12-19 DIAGNOSIS — Z1231 Encounter for screening mammogram for malignant neoplasm of breast: Secondary | ICD-10-CM

## 2018-01-23 ENCOUNTER — Ambulatory Visit
Admission: RE | Admit: 2018-01-23 | Discharge: 2018-01-23 | Disposition: A | Discharge status: Home / Self Care | Payer: Managed Care, Other (non HMO) | Source: Ambulatory Visit | Attending: Physician Assistant | Admitting: Physician Assistant

## 2018-01-23 DIAGNOSIS — Z1231 Encounter for screening mammogram for malignant neoplasm of breast: Secondary | ICD-10-CM

## 2022-02-26 ENCOUNTER — Ambulatory Visit
Admission: EM | Admit: 2022-02-26 | Discharge: 2022-02-26 | Disposition: A | Discharge status: Home / Self Care | Payer: Managed Care, Other (non HMO) | Attending: Urgent Care | Admitting: Urgent Care

## 2022-02-26 ENCOUNTER — Encounter: Payer: Self-pay | Admitting: Emergency Medicine

## 2022-02-26 DIAGNOSIS — B349 Viral infection, unspecified: Secondary | ICD-10-CM | POA: Diagnosis not present

## 2022-02-26 DIAGNOSIS — J111 Influenza due to unidentified influenza virus with other respiratory manifestations: Secondary | ICD-10-CM | POA: Diagnosis not present

## 2022-02-26 LAB — POC SARS CORONAVIRUS 2 AG -  ED: SARS Coronavirus 2 Ag: NEGATIVE

## 2022-02-26 LAB — POCT INFLUENZA A/B
Influenza A, POC: NEGATIVE
Influenza B, POC: NEGATIVE

## 2022-02-26 MED ORDER — XOFLUZA (40 MG DOSE) 1 X 40 MG PO TBPK: 1.0000 | ORAL_TABLET | Freq: Once | ORAL | 0 refills | Status: AC

## 2022-02-26 NOTE — Discharge Instructions (Signed)
Your COVID and flu test are negative, however given your exposures at work, I would recommend that we give you a single dose of Xofluza to cover for suspected flulike illness. Take 1 tablet x 1 dose. I also recommend taking over-the-counter Oscillococcinum to help with body aches. Rest! Hydrate!

## 2022-02-26 NOTE — ED Provider Notes (Signed)
[facility]    CSN: [id number] Arrival date & time: 02/26/22  1113      History   Chief Complaint Chief Complaint  Patient presents with   Generalized Body Aches    HPI [name] is a 41 y.o. female.   Pleasant 41 year old female presents today due to concerns of acute onset of body aches headache chills and fever over the past 24 hours.  Took a COVID test yesterday morning when symptoms started and it was negative.  Patient went to work today, but states she had to leave due to the severe myalgias.  She states she feels terrible.  Tmax 102. 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     Past Medical History:  Diagnosis Date   Allergy    Asthma    Bipolar 1 disorder (HCC)    Depression    GERD (gastroesophageal reflux disease)    Hypertension    Normal pregnancy 03/11/2011   PCOS (polycystic ovarian syndrome)    SVD (spontaneous vaginal delivery) 03/11/2011   UTI (lower urinary tract infection)     Patient Active Problem List   Diagnosis Date Noted   Lumbar paraspinal muscle spasm 05/16/2015   SVD (spontaneous vaginal delivery) 03/11/2011    Past Surgical History:  Procedure Laterality Date   cyst removed     ovarian cyst removed   WISDOM TOOTH EXTRACTION      OB History     Gravida  4   Para  2   Term  2   Preterm      AB      Living  2      SAB      IAB      Ectopic      Multiple      Live Births  2            Home Medications    Prior to Admission medications   Medication Sig Start Date End Date Taking? Authorizing Provider  Baloxavir Marboxil,40 MG Dose, (XOFLUZA, 40 MG DOSE,) 1 x 40 MG TBPK Take 1 tablet by mouth once for 1 dose. 02/26/22 02/26/22 Yes Breianna Delfino, Jodelle Gross, PA    Family History Family History  Problem Relation Age of Onset   Diabetes Mother    Heart disease Mother     Social History Social History   Tobacco Use   Smoking status: Former     Years: 15.00    Types: Cigarettes    Quit date: 07/08/2010    Years since quitting: 11.6   Smokeless tobacco: Never  Substance Use Topics   Alcohol use: No   Drug use: No     Allergies   Patient has no known allergies.   Review of Systems Review of Systems As per HPI  Physical Exam Triage Vital Signs ED Triage Vitals  Enc Vitals Group     BP 02/26/22 1235 (!) 134/96     Pulse Rate 02/26/22 1235 61     Resp 02/26/22 1235 18     Temp 02/26/22 1235 98.9 F (37.2 C)     Temp Source 02/26/22 1235 Oral     SpO2 02/26/22 1235 97 %     Weight --      Height --      Head Circumference --      Peak Flow --      Pain Score 02/26/22 1233 5  Pain Loc --      Pain Edu? --      Excl. in GC? --    No data found.  Updated Vital Signs BP (!) 134/96 (BP Location: Left Arm)   Pulse 61   Temp 98.9 F (37.2 C) (Oral)   Resp 18   SpO2 97%   Visual Acuity Right Eye Distance:   Left Eye Distance:   Bilateral Distance:    Right Eye Near:   Left Eye Near:    Bilateral Near:     Physical Exam Vitals and nursing note reviewed.  Constitutional:      General: She is not in acute distress.    Appearance: She is well-developed and normal weight. She is ill-appearing. She is not toxic-appearing or diaphoretic.  HENT:     Head: Normocephalic and atraumatic.     Right Ear: Tympanic membrane, ear canal and external ear normal. There is no impacted cerumen.     Left Ear: Tympanic membrane, ear canal and external ear normal. There is no impacted cerumen.     Nose: Nose normal. No congestion or rhinorrhea.     Mouth/Throat:     Mouth: Mucous membranes are moist.     Pharynx: No oropharyngeal exudate or posterior oropharyngeal erythema.  Eyes:     General: No scleral icterus.       Right eye: No discharge.        Left eye: No discharge.     Extraocular Movements: Extraocular movements intact.     Conjunctiva/sclera: Conjunctivae normal.     Pupils: Pupils are equal, round, and  reactive to light.  Cardiovascular:     Rate and Rhythm: Normal rate and regular rhythm.     Pulses: Normal pulses.     Heart sounds: Normal heart sounds. No murmur heard.    No gallop.  Pulmonary:     Effort: Pulmonary effort is normal. No respiratory distress.     Breath sounds: Normal breath sounds. No stridor. No wheezing, rhonchi or rales.  Chest:     Chest wall: No tenderness.  Abdominal:     Palpations: Abdomen is soft.     Tenderness: There is no abdominal tenderness.  Musculoskeletal:        General: No swelling or tenderness. Normal range of motion.     Cervical back: Normal range of motion and neck supple. No tenderness.     Right lower leg: No edema.     Left lower leg: No edema.  Lymphadenopathy:     Cervical: No cervical adenopathy.  Skin:    General: Skin is warm and dry.     Capillary Refill: Capillary refill takes less than 2 seconds.     Findings: No rash.  Neurological:     General: No focal deficit present.     Mental Status: She is alert and oriented to person, place, and time.  Psychiatric:        Mood and Affect: Mood normal.      UC Treatments / Results  Labs (all labs ordered are listed, but only abnormal results are displayed) Labs Reviewed  POCT INFLUENZA A/B  POC SARS CORONAVIRUS 2 AG -  ED    EKG   Radiology No results found.  Procedures Procedures (including critical care time)  Medications Ordered in UC Medications - No data to display  Initial Impression / Assessment and Plan / UC Course  I have reviewed the triage vital signs and the nursing notes.  Pertinent labs &  imaging results that were available during my care of the patient were reviewed by me and considered in my medical decision making (see chart for details).     Viral illness -COVID test negative.  In office flu test negative, however given exposures and symptom presentation, feel patient would benefit from a single dose Xofluza.  Discussed risks, benefits, side  effects.  Out of work x 3 days.  Return to clinic precautions reviewed.  Final Clinical Impressions(s) / UC Diagnoses   Final diagnoses:  Viral illness  Influenza-like illness     Discharge Instructions      Your COVID and flu test are negative, however given your exposures at work, I would recommend that we give you a single dose of Xofluza to cover for suspected flulike illness. Take 1 tablet x 1 dose. I also recommend taking over-the-counter Oscillococcinum to help with body aches. Rest! Hydrate!     ED Prescriptions     Medication Sig Dispense Auth. Provider   Baloxavir Marboxil,40 MG Dose, (XOFLUZA, 40 MG DOSE,) 1 x 40 MG TBPK Take 1 tablet by mouth once for 1 dose. 1 each Maretta Bees, PA      PDMP not reviewed this encounter.   Maretta Bees, [place] 02/26/22 1431

## 2022-02-26 NOTE — ED Triage Notes (Signed)
Patient presents to Urgent Care with complaints of fever(100.78F), body aches since 1 day ago. Patient reports taking home covid test was negative. Having headache, nausea, fatigued, and dizzy.  Taking Dayqil, Nyquil and Tylenol cold and flu. Decreased appetite.

## 2023-03-14 ENCOUNTER — Ambulatory Visit
Admission: RE | Admit: 2023-03-14 | Discharge: 2023-03-14 | Disposition: A | Discharge status: Home / Self Care | Payer: Managed Care, Other (non HMO) | Source: Ambulatory Visit | Attending: Family Medicine | Admitting: Family Medicine

## 2023-03-14 ENCOUNTER — Other Ambulatory Visit: Payer: Self-pay

## 2023-03-14 VITALS — BP 127/85 | HR 70 | Temp 99.0°F | Resp 18 | Ht 64.0 in | Wt 156.0 lb

## 2023-03-14 DIAGNOSIS — J309 Allergic rhinitis, unspecified: Secondary | ICD-10-CM

## 2023-03-14 DIAGNOSIS — J0100 Acute maxillary sinusitis, unspecified: Secondary | ICD-10-CM

## 2023-03-14 DIAGNOSIS — R059 Cough, unspecified: Secondary | ICD-10-CM

## 2023-03-14 LAB — POCT INFLUENZA A/B
Influenza A, POC: NEGATIVE
Influenza B, POC: NEGATIVE

## 2023-03-14 LAB — POC SARS CORONAVIRUS 2 AG -  ED: SARS Coronavirus 2 Ag: NEGATIVE

## 2023-03-14 MED ORDER — PREDNISONE 20 MG PO TABS: ORAL_TABLET | ORAL | 0 refills | Status: AC

## 2023-03-14 MED ORDER — BENZONATATE 200 MG PO CAPS: 200.0000 mg | ORAL_CAPSULE | Freq: Three times a day (TID) | ORAL | 0 refills | Status: AC | PRN

## 2023-03-14 MED ORDER — PROMETHAZINE-DM 6.25-15 MG/5ML PO SYRP: 5.0000 mL | ORAL_SOLUTION | Freq: Two times a day (BID) | ORAL | 0 refills | Status: AC | PRN

## 2023-03-14 MED ORDER — AMOXICILLIN-POT CLAVULANATE 875-125 MG PO TABS: 1.0000 | ORAL_TABLET | Freq: Two times a day (BID) | ORAL | 0 refills | Status: AC

## 2023-03-14 MED ORDER — FEXOFENADINE HCL 180 MG PO TABS: 180.0000 mg | ORAL_TABLET | Freq: Every day | ORAL | 0 refills | Status: AC

## 2023-03-14 NOTE — Discharge Instructions (Addendum)
 Advised patient to take medications as directed with food to completion.  Advised patient to take Allegra  and prednisone  with first dose of Augmentin  for the next 5 of 7 days.  Advised may use Allegra  as needed afterwards for concurrent postnasal drainage/drip.  Advised may use Tessalon  capsules daily or as needed for cough.  Advised may use Promethazine  DM for cough at night prior to sleep due to sedative effects.  Encouraged to increase daily water intake to 64 ounces per day while taking these medications.  Advised if symptoms worsen and/or unresolved please follow-up PCP or here for further evaluation.

## 2023-03-14 NOTE — ED Provider Notes (Signed)
 [facility]    CSN: [id number] Arrival date & time: 03/14/23  1025      History   Chief Complaint Chief Complaint  Patient presents with   Cough    Entered by patient    HPI Linda Guadiana Whiters is a 43 y.o. female.   HPI 43 year old female presents with cough.  PMH significant for asthma, bipolar 1 disorder, and PCOS.  Past Medical History:  Diagnosis Date   Allergy    Asthma    Bipolar 1 disorder (HCC)    Depression    GERD (gastroesophageal reflux disease)    Hypertension    Normal pregnancy 03/11/2011   PCOS (polycystic ovarian syndrome)    SVD (spontaneous vaginal delivery) 03/11/2011   UTI (lower urinary tract infection)     Patient Active Problem List   Diagnosis Date Noted   Lumbar paraspinal muscle spasm 05/16/2015   SVD (spontaneous vaginal delivery) 03/11/2011    Past Surgical History:  Procedure Laterality Date   cyst removed     ovarian cyst removed   WISDOM TOOTH EXTRACTION      OB History     Gravida  4   Para  2   Term  2   Preterm      AB      Living  2      SAB      IAB      Ectopic      Multiple      Live Births  2            Home Medications    Prior to Admission medications   Medication Sig Start Date End Date Taking? Authorizing Provider  amoxicillin -clavulanate (AUGMENTIN ) 875-125 MG tablet Take 1 tablet by mouth every 12 (twelve) hours. 03/14/23  Yes Teddy Sharper, FNP  benzonatate  (TESSALON ) 200 MG capsule Take 1 capsule (200 mg total) by mouth 3 (three) times daily as needed for up to 7 days. 03/14/23 03/21/23 Yes Teddy Sharper, FNP  fexofenadine  (ALLEGRA  ALLERGY) 180 MG tablet Take 1 tablet (180 mg total) by mouth daily for 15 days. 03/14/23 03/29/23 Yes Teddy Sharper, FNP  predniSONE  (DELTASONE ) 20 MG tablet Take 3 tabs PO daily x 5 days. 03/14/23  Yes Teddy Sharper, FNP  promethazine -dextromethorphan (PROMETHAZINE -DM) 6.25-15 MG/5ML syrup Take 5 mLs by mouth 2 (two) times daily as needed for cough.  03/14/23  Yes Teddy Sharper, FNP    Family History Family History  Problem Relation Age of Onset   Diabetes Mother    Heart disease Mother    Healthy Father     Social History Social History   Tobacco Use   Smoking status: Former    Current packs/day: 0.00    Types: Cigarettes    Start date: 07/08/1995    Quit date: 07/08/2010    Years since quitting: 12.6   Smokeless tobacco: Never  Vaping Use   Vaping status: Never Used  Substance Use Topics   Alcohol use: No   Drug use: No     Allergies   Patient has no known allergies.   Review of Systems Review of Systems  HENT:  Positive for congestion, postnasal drip, rhinorrhea, sinus pressure and sore throat.   Respiratory:  Positive for cough.   All other systems reviewed and are negative.    Physical Exam Triage Vital Signs ED Triage Vitals  Encounter Vitals Group     BP      Systolic BP Percentile  Diastolic BP Percentile      Pulse      Resp      Temp      Temp src      SpO2      Weight      Height      Head Circumference      Peak Flow      Pain Score      Pain Loc      Pain Education      Exclude from Growth Chart    No data found.  Updated Vital Signs BP 127/85 (BP Location: Right Arm)   Pulse 70   Temp 99 F (37.2 C) (Oral)   Resp 18   Ht 5' 4 (1.626 m)   Wt 156 lb (70.8 kg)   SpO2 97%   BMI 26.78 kg/m    Physical Exam Vitals and nursing note reviewed.  Constitutional:      General: She is not in acute distress.    Appearance: Normal appearance. She is normal weight. She is not ill-appearing.  HENT:     Head: Normocephalic and atraumatic.     Right Ear: Tympanic membrane and external ear normal.     Left Ear: Tympanic membrane and external ear normal.     Ears:     Comments: Significant eustachian tube dysfunction noted bilaterally    Nose:     Comments: Turbinates are erythematous/edematous    Mouth/Throat:     Mouth: Mucous membranes are moist.     Pharynx: Oropharynx is  clear.     Comments: Significant amount of clear drainage of posterior oropharynx noted Eyes:     Extraocular Movements: Extraocular movements intact.     Conjunctiva/sclera: Conjunctivae normal.     Pupils: Pupils are equal, round, and reactive to light.  Cardiovascular:     Rate and Rhythm: Normal rate and regular rhythm.     Pulses: Normal pulses.     Heart sounds: Normal heart sounds.  Pulmonary:     Effort: Pulmonary effort is normal.     Breath sounds: No wheezing, rhonchi or rales.     Comments: Frequent nonproductive cough noted on exam Musculoskeletal:        General: Normal range of motion.     Cervical back: Normal range of motion and neck supple.  Skin:    General: Skin is warm and dry.  Neurological:     General: No focal deficit present.     Mental Status: She is alert and oriented to person, place, and time. Mental status is at baseline.  Psychiatric:        Mood and Affect: Mood normal.        Behavior: Behavior normal.      UC Treatments / Results  Labs (all labs ordered are listed, but only abnormal results are displayed) Labs Reviewed  POC SARS CORONAVIRUS 2 AG -  ED  POCT INFLUENZA A/B    EKG   Radiology No results found.  Procedures Procedures (including critical care time)  Medications Ordered in UC Medications - No data to display  Initial Impression / Assessment and Plan / UC Course  I have reviewed the triage vital signs and the nursing notes.  Pertinent labs & imaging results that were available during my care of the patient were reviewed by me and considered in my medical decision making (see chart for details).    MDM: 1.  Acute maxillary sinusitis, recurrence not specified-Rx'd Augmentin  875/125 mg tablet:  Take 1 tablet twice daily x 7 days; 2.  Cough, unspecified type-Rx'd prednisone  20 mg tablet: Take 3 tabs p.o. daily x 5 days, Rx'd Tessalon  200 mg capsule: Take 1 capsule 3 times daily, as needed for cough, Rx'd promethazine  DM  6.25-15 Mg/5 mL syrup: Take 5 mL 2 times daily as needed for cough. Advised patient to take medications as directed with food to completion.  Advised patient to take Allegra  and prednisone  with first dose of Augmentin  for the next 5 of 7 days.  Advised may use Allegra  as needed afterwards for concurrent postnasal drainage/drip.  Advised may use Tessalon  capsules daily or as needed for cough.  Advised may use Promethazine  DM for cough at night prior to sleep due to sedative effects.  Encouraged to increase daily water intake to 64 ounces per day while taking these medications.  Advised if symptoms worsen and/or unresolved please follow-up PCP or here for further evaluation.  Final Clinical Impressions(s) / UC Diagnoses   Final diagnoses:  Cough, unspecified type  Acute maxillary sinusitis, recurrence not specified  Allergic rhinitis, unspecified seasonality, unspecified trigger     Discharge Instructions      Advised patient to take medications as directed with food to completion.  Advised patient to take Allegra  and prednisone  with first dose of Augmentin  for the next 5 of 7 days.  Advised may use Allegra  as needed afterwards for concurrent postnasal drainage/drip.  Advised may use Tessalon  capsules daily or as needed for cough.  Advised may use Promethazine  DM for cough at night prior to sleep due to sedative effects.  Encouraged to increase daily water intake to 64 ounces per day while taking these medications.  Advised if symptoms worsen and/or unresolved please follow-up PCP or here for further evaluation.     ED Prescriptions     Medication Sig Dispense Auth. Provider   amoxicillin -clavulanate (AUGMENTIN ) 875-125 MG tablet Take 1 tablet by mouth every 12 (twelve) hours. 14 tablet Day Deery, FNP   predniSONE  (DELTASONE ) 20 MG tablet Take 3 tabs PO daily x 5 days. 15 tablet Timon Geissinger, FNP   benzonatate  (TESSALON ) 200 MG capsule Take 1 capsule (200 mg total) by mouth 3 (three)  times daily as needed for up to 7 days. 40 capsule Charles Andringa, FNP   fexofenadine  (ALLEGRA  ALLERGY) 180 MG tablet Take 1 tablet (180 mg total) by mouth daily for 15 days. 15 tablet Angelique Chevalier, FNP   promethazine -dextromethorphan (PROMETHAZINE -DM) 6.25-15 MG/5ML syrup Take 5 mLs by mouth 2 (two) times daily as needed for cough. 118 mL Johncarlo Maalouf, FNP      PDMP not reviewed this encounter.   Teddy Sharper, FNP 03/14/23 1133

## 2023-03-14 NOTE — ED Triage Notes (Signed)
 Patient c/o cough, nasal drainage, sore throat, fatigue, bodyaches and chills x 2 days.  Patient has taken Alka Seltzer Cold & Flu and Ibuprofen.

## 2023-03-19 ENCOUNTER — Ambulatory Visit
Admission: RE | Admit: 2023-03-19 | Discharge: 2023-03-19 | Disposition: A | Discharge status: Home / Self Care | Payer: Managed Care, Other (non HMO) | Source: Ambulatory Visit | Attending: Family Medicine | Admitting: Family Medicine

## 2023-03-19 ENCOUNTER — Ambulatory Visit (INDEPENDENT_AMBULATORY_CARE_PROVIDER_SITE_OTHER): Payer: Managed Care, Other (non HMO)

## 2023-03-19 ENCOUNTER — Other Ambulatory Visit: Payer: Self-pay

## 2023-03-19 VITALS — BP 126/87 | HR 80 | Temp 98.3°F

## 2023-03-19 DIAGNOSIS — R1032 Left lower quadrant pain: Secondary | ICD-10-CM

## 2023-03-19 DIAGNOSIS — R0781 Pleurodynia: Secondary | ICD-10-CM

## 2023-03-19 DIAGNOSIS — R059 Cough, unspecified: Secondary | ICD-10-CM

## 2023-03-19 NOTE — Discharge Instructions (Addendum)
 Advised patient to go to [facility] ED,  [place], [place] for further evaluation of right chest pain and right upper abdominal pain.  Advised patient may need CT imaging of chest and abdomen.  Advised patient we will follow-up with chest x-ray results once received.

## 2023-03-19 NOTE — ED Provider Notes (Signed)
 [name]    CSN: [id number] Arrival date & time: 03/19/23  1426      History   Chief Complaint Chief Complaint  Patient presents with   Chest Pain    RT rib area    HPI [name] is a 43 y.o. female.   HPI 43 year old female presents with right rib pain that started at 11 AM.  Patient was evaluated here on 03/14/2023 for cough.  Past Medical History:  Diagnosis Date   Allergy    Asthma    Bipolar 1 disorder (HCC)    Depression    GERD (gastroesophageal reflux disease)    Hypertension    Normal pregnancy 03/11/2011   PCOS (polycystic ovarian syndrome)    SVD (spontaneous vaginal delivery) 03/11/2011   UTI (lower urinary tract infection)     Patient Active Problem List   Diagnosis Date Noted   Lumbar paraspinal muscle spasm 05/16/2015   SVD (spontaneous vaginal delivery) 03/11/2011    Past Surgical History:  Procedure Laterality Date   cyst removed     ovarian cyst removed   WISDOM TOOTH EXTRACTION      OB History     Gravida  4   Para  2   Term  2   Preterm      AB      Living  2      SAB      IAB      Ectopic      Multiple      Live Births  2            Home Medications    Prior to Admission medications   Medication Sig Start Date End Date Taking? Authorizing Provider  amoxicillin -clavulanate (AUGMENTIN ) 875-125 MG tablet Take 1 tablet by mouth every 12 (twelve) hours. 03/14/23   Belia Febo, FNP  benzonatate  (TESSALON ) 200 MG capsule Take 1 capsule (200 mg total) by mouth 3 (three) times daily as needed for up to 7 days. 03/14/23 03/21/23  Leonides Ramp, FNP  fexofenadine  (ALLEGRA  ALLERGY) 180 MG tablet Take 1 tablet (180 mg total) by mouth daily for 15 days. 03/14/23 03/29/23  Leonides Ramp, FNP  predniSONE  (DELTASONE ) 20 MG tablet Take 3 tabs PO daily x 5 days. 03/14/23   Jacub Waiters, FNP  promethazine -dextromethorphan (PROMETHAZINE -DM) 6.25-15 MG/5ML syrup Take 5 mLs by mouth 2 (two) times daily as needed  for cough. 03/14/23   Leonides Ramp, FNP    Family History Family History  Problem Relation Age of Onset   Diabetes Mother    Heart disease Mother    Healthy Father     Social History Social History   Tobacco Use   Smoking status: Former    Current packs/day: 0.00    Types: Cigarettes    Start date: 07/08/1995    Quit date: 07/08/2010    Years since quitting: 12.7   Smokeless tobacco: Never  Vaping Use   Vaping status: Never Used  Substance Use Topics   Alcohol use: No   Drug use: No     Allergies   Patient has no known allergies.   Review of Systems Review of Systems  Musculoskeletal:        Right rib pain that began at 11 AM this morning  All other systems reviewed and are negative.    Physical Exam Triage Vital Signs ED Triage Vitals  Encounter Vitals Group     BP 03/19/23 1439 126/87     Systolic BP  Percentile --      Diastolic BP Percentile --      Pulse Rate 03/19/23 1439 80     Resp --      Temp 03/19/23 1439 98.3 F (36.8 C)     Temp Source 03/19/23 1439 Oral     SpO2 03/19/23 1439 100 %     Weight --      Height --      Head Circumference --      Peak Flow --      Pain Score 03/19/23 1437 0     Pain Loc --      Pain Education --      Exclude from Growth Chart --    No data found.  Updated Vital Signs BP 126/87 (BP Location: Left Arm)   Pulse 80   Temp 98.3 F (36.8 C) (Oral)   SpO2 100%    Physical Exam Vitals and nursing note reviewed.  Constitutional:      General: She is not in acute distress.    Appearance: Normal appearance. She is ill-appearing.  HENT:     Head: Normocephalic and atraumatic.     Mouth/Throat:     Mouth: Mucous membranes are moist.     Pharynx: Oropharynx is clear.  Eyes:     Conjunctiva/sclera: Conjunctivae normal.     Pupils: Pupils are equal, round, and reactive to light.  Cardiovascular:     Rate and Rhythm: Normal rate and regular rhythm.     Pulses: Normal pulses.     Heart sounds: Normal heart  sounds.  Pulmonary:     Effort: Pulmonary effort is normal.     Breath sounds: Normal breath sounds. No wheezing, rhonchi or rales.     Comments: TTP over right lower lateral rib cage Abdominal:     Tenderness: There is abdominal tenderness. There is no right CVA tenderness or left CVA tenderness.     Comments: Mildly TTP between left lower quadrant and left upper quadrant  Musculoskeletal:        General: Normal range of motion.     Cervical back: Normal range of motion and neck supple.  Skin:    General: Skin is warm and dry.  Neurological:     General: No focal deficit present.     Mental Status: She is alert and oriented to person, place, and time. Mental status is at baseline.  Psychiatric:        Mood and Affect: Mood normal.        Behavior: Behavior normal.      UC Treatments / Results  Labs (all labs ordered are listed, but only abnormal results are displayed) Labs Reviewed - No data to display  EKG   Radiology No results found.  Procedures Procedures (including critical care time)  Medications Ordered in UC Medications - No data to display  Initial Impression / Assessment and Plan / UC Course  I have reviewed the triage vital signs and the nursing notes.  Pertinent labs & imaging results that were available during my care of the patient were reviewed by me and considered in my medical decision making (see chart for details).     MDM: 1.  Rib pain on right side-Advised patient to go to [facility] ED [place], [place] for further evaluation of right chest pain and right upper abdominal pain.  2.  Abdominal pain, left lower quadrant- Advised patient to go to [facility] [place] advised patient may need CT imaging of chest and abdomen.  Advised patient we will follow-up with chest x-ray results once received.  Patient agreed and verbalized understanding of these instructions and plan of care today.  Patient  discharged to ED hemodynamically stable. Final Clinical Impressions(s) / UC Diagnoses   Final diagnoses:  Rib pain on right side  Abdominal pain, left lower quadrant     Discharge Instructions      Advised patient to go to [facility] ED,  [facility], [place] for further evaluation of right chest pain and right upper abdominal pain.  Advised patient may need CT imaging of chest and abdomen.  Advised patient we will follow-up with chest x-ray results once received.     ED Prescriptions   None    PDMP not reviewed this encounter.   Leonides Ramp, FNP 03/19/23 1552

## 2023-03-19 NOTE — ED Triage Notes (Signed)
 Pt c/o RT rib pain that started about 11am. Pain has been constant, worsening in the last 2 hours. Worse with cough. Was recently here for sinus infection. Finished prednisone  and still on abx and allergy pill.

## 2023-03-19 NOTE — ED Notes (Signed)
 Patient is being discharged from the Urgent Care and sent to the Emergency Department via POV. Per Leonides Ramp FNP, patient is in need of higher level of care due to RT sided chest pain and need for advanced imaging. Patient is aware and verbalizes understanding of plan of care.  Vitals:   03/19/23 1439  BP: 126/87  Pulse: 80  Temp: 98.3 F (36.8 C)  SpO2: 100%
